# Patient Record
Sex: Male | Born: 1990 | Race: White | Hispanic: No | Marital: Single
Health system: Western US, Academic
[De-identification: ages and names within clinical notes are randomized; demographics above are authoritative.]

## PROBLEM LIST (undated history)

## (undated) DIAGNOSIS — F419 Anxiety disorder, unspecified: Secondary | ICD-10-CM

## (undated) DIAGNOSIS — F32A Depression, unspecified: Secondary | ICD-10-CM

## (undated) DIAGNOSIS — F329 Major depressive disorder, single episode, unspecified: Secondary | ICD-10-CM

## (undated) HISTORY — PX: TESTICLE SURGERY: SHX794

---
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
## 2012-03-05 ENCOUNTER — Other Ambulatory Visit: Payer: Self-pay | Admitting: Physician Assistant

## 2012-03-05 LAB — COMPREHENSIVE METABOLIC PANEL
Albumin: 4.4 g/dL (ref 3.8–5.6)
Alkaline Phosphatase: 85 U/L — ABNORMAL LOW (ref 98–317)
Anion Gap: 7 (ref 7–16)
BUN: 8 mg/dL (ref 7–18)
Bilirubin,Total: 0.5 mg/dL (ref 0.2–1.0)
Chloride: 104 mmol/L (ref 98–107)
EGFR (African American): >60
Glucose: 97 mg/dL (ref 65–99)
Osmolality: 280 (ref 275–301)
Potassium: 4.3 mmol/L (ref 3.5–5.1)
SGOT(AST): 19 U/L (ref 10–41)
Sodium: 141 mmol/L (ref 136–145)
Total Protein: 7.6 g/dL (ref 6.4–8.6)

## 2012-03-05 LAB — CBC WITH DIFFERENTIAL/PLATELET
Basophil #: 0.1 10*3/uL (ref 0.0–0.1)
Eosinophil #: 0 10*3/uL (ref 0.0–0.7)
Eosinophil %: 0.5 %
HGB: 13.4 g/dL (ref 13.0–18.0)
Lymphocyte #: 1.4 10*3/uL (ref 1.0–3.6)
MCH: 32.4 pg (ref 26.0–34.0)
MCV: 95 fL (ref 80–100)
Monocyte #: 0.5 x10 3/mm (ref 0.2–1.0)
Neutrophil %: 65.3 %
Platelet: 254 10*3/uL (ref 150–440)
RBC: 4.13 10*6/uL — ABNORMAL LOW (ref 4.40–5.90)
RDW: 12.9 % (ref 11.5–14.5)

## 2012-03-05 LAB — OCCULT BLOOD X 1 CARD TO LAB, STOOL: Occult Blood, Feces: NEGATIVE

## 2012-03-07 LAB — STOOL CULTURE

## 2012-08-02 ENCOUNTER — Encounter (HOSPITAL_COMMUNITY): Payer: Self-pay | Admitting: *Deleted

## 2012-08-02 ENCOUNTER — Emergency Department (HOSPITAL_COMMUNITY)
Admission: EM | Admit: 2012-08-02 | Discharge: 2012-08-03 | Disposition: A | Discharge status: Home / Self Care | Payer: BC Managed Care – PPO | Attending: Emergency Medicine | Admitting: Emergency Medicine

## 2012-08-02 DIAGNOSIS — F17200 Nicotine dependence, unspecified, uncomplicated: Secondary | ICD-10-CM | POA: Insufficient documentation

## 2012-08-02 DIAGNOSIS — Z8659 Personal history of other mental and behavioral disorders: Secondary | ICD-10-CM | POA: Insufficient documentation

## 2012-08-02 DIAGNOSIS — L589 Radiodermatitis, unspecified: Secondary | ICD-10-CM | POA: Insufficient documentation

## 2012-08-02 DIAGNOSIS — W891XXA Exposure to tanning bed, initial encounter: Secondary | ICD-10-CM

## 2012-08-02 DIAGNOSIS — R5381 Other malaise: Secondary | ICD-10-CM | POA: Insufficient documentation

## 2012-08-02 HISTORY — DX: Anxiety disorder, unspecified: F41.9

## 2012-08-02 NOTE — ED Notes (Signed)
Pt states laid in new tanning bed for 8 mins this morning; presents with sun burn over most of body

## 2012-08-03 MED ORDER — PREDNISONE 20 MG PO TABS: 40.0000 mg | ORAL_TABLET | Freq: Every day | ORAL | Status: DC

## 2012-08-03 MED ORDER — PREDNISONE 20 MG PO TABS
40.0000 mg | ORAL_TABLET | Freq: Once | ORAL | Status: AC
  Administered 2012-08-03: 40 mg via ORAL
  Filled 2012-08-03: qty 2

## 2012-08-03 NOTE — ED Provider Notes (Signed)
Medical screening examination/treatment/procedure(s) were performed by non-physician practitioner and as supervising physician I was immediately available for consultation/collaboration.    Neema Barreira L Mikaelyn Arthurs, MD 08/03/12 0144 

## 2012-08-03 NOTE — ED Provider Notes (Signed)
History     CSN: [id number]  Arrival date & time 08/02/12  2334   First MD Initiated Contact with Patient 08/03/12 0042      No chief complaint on file.   (Consider location/radiation/quality/duration/timing/severity/associated sxs/prior treatment) HPI Comments: Patient went into tanning new/different tanning bed for 8 minutes which he has been doing in older models Now with overall burn, itching and generalized discomfort Tried taking Hydralazine without any relief from his itching   The history is provided by the patient.    Past Medical History  Diagnosis Date  . Anxiety     History reviewed. No pertinent past surgical history.  No family history on file.  History  Substance Use Topics  . Smoking status: Current Every Day Smoker    Types: Cigarettes  . Smokeless tobacco: Not on file  . Alcohol Use: No      Review of Systems  Constitutional: Negative for fever and chills.  HENT: Negative for congestion.   Eyes: Negative for visual disturbance.  Respiratory: Negative for shortness of breath.   Cardiovascular: Negative for chest pain.  Gastrointestinal: Negative for nausea.  Musculoskeletal: Negative for myalgias and joint swelling.  Skin: Positive for color change.  Neurological: Positive for weakness. Negative for dizziness.    Allergies  Review of patient's allergies indicates no known allergies.  Home Medications   Current Outpatient Rx  Name  Route  Sig  Dispense  Refill  . PREDNISONE 20 MG PO TABS   Oral   Take 2 tablets (40 mg total) by mouth daily.   10 tablet   0     BP 138/82  Pulse 107  Temp 97.7 F (36.5 C)  Resp 20  SpO2 100%  Physical Exam  Constitutional: He appears well-developed and well-nourished.  HENT:  Head: Normocephalic.  Eyes: Pupils are equal, round, and reactive to light.  Neck: Normal range of motion.  Musculoskeletal: Normal range of motion.  Neurological: He is alert.  Skin:       Over all sunburn  No  blistering noted scrotum and penis anterior surfaces affected posterior surfaces not reddened     ED Course  Procedures (including critical care time)  Labs Reviewed - No data to display No results found.   1. Sunburn due to tanning bed       MDM   Will treat with Prednisone due to the extent of the burn         Arman Filter, NP 08/03/12 0105

## 2013-08-10 ENCOUNTER — Emergency Department (HOSPITAL_COMMUNITY)
Admission: EM | Admit: 2013-08-10 | Discharge: 2013-08-10 | Disposition: A | Discharge status: Home / Self Care | Payer: BC Managed Care – PPO | Attending: Emergency Medicine | Admitting: Emergency Medicine

## 2013-08-10 ENCOUNTER — Encounter (HOSPITAL_COMMUNITY): Payer: Self-pay | Admitting: Emergency Medicine

## 2013-08-10 DIAGNOSIS — F411 Generalized anxiety disorder: Secondary | ICD-10-CM | POA: Insufficient documentation

## 2013-08-10 DIAGNOSIS — F329 Major depressive disorder, single episode, unspecified: Secondary | ICD-10-CM | POA: Insufficient documentation

## 2013-08-10 DIAGNOSIS — Z79899 Other long term (current) drug therapy: Secondary | ICD-10-CM | POA: Insufficient documentation

## 2013-08-10 DIAGNOSIS — Z792 Long term (current) use of antibiotics: Secondary | ICD-10-CM | POA: Insufficient documentation

## 2013-08-10 DIAGNOSIS — F17200 Nicotine dependence, unspecified, uncomplicated: Secondary | ICD-10-CM | POA: Insufficient documentation

## 2013-08-10 DIAGNOSIS — R300 Dysuria: Secondary | ICD-10-CM | POA: Insufficient documentation

## 2013-08-10 DIAGNOSIS — N4889 Other specified disorders of penis: Secondary | ICD-10-CM

## 2013-08-10 DIAGNOSIS — N489 Disorder of penis, unspecified: Secondary | ICD-10-CM | POA: Insufficient documentation

## 2013-08-10 DIAGNOSIS — F3289 Other specified depressive episodes: Secondary | ICD-10-CM | POA: Insufficient documentation

## 2013-08-10 HISTORY — DX: Depression, unspecified: F32.A

## 2013-08-10 HISTORY — DX: Major depressive disorder, single episode, unspecified: F32.9

## 2013-08-10 LAB — URINALYSIS, ROUTINE W REFLEX MICROSCOPIC
Bilirubin Urine: NEGATIVE
Glucose, UA: NEGATIVE mg/dL
Hgb urine dipstick: NEGATIVE
Ketones, ur: NEGATIVE mg/dL
Leukocytes, UA: NEGATIVE
Nitrite: NEGATIVE
Protein, ur: NEGATIVE mg/dL
Specific Gravity, Urine: 1.003 — ABNORMAL LOW (ref 1.005–1.030)
Urobilinogen, UA: 0.2 mg/dL (ref 0.0–1.0)
pH: 6.5 (ref 5.0–8.0)

## 2013-08-10 MED ORDER — CEFTRIAXONE SODIUM 250 MG IJ SOLR
250.0000 mg | Freq: Once | INTRAMUSCULAR | Status: AC
  Administered 2013-08-10: 250 mg via INTRAMUSCULAR
  Filled 2013-08-10: qty 250

## 2013-08-10 MED ORDER — AZITHROMYCIN 250 MG PO TABS
1000.0000 mg | ORAL_TABLET | Freq: Once | ORAL | Status: AC
  Administered 2013-08-10: 1000 mg via ORAL
  Filled 2013-08-10: qty 4

## 2013-08-10 NOTE — ED Notes (Signed)
Pt c/o penile pain and irritation x 1 week and "knot" on L groin since last night.  Pain score 6/10.  Pt sts pain and irritation started on the tip and has progressed "up the shaft."  Pt has been seen by [facility] and sts clean STD check and UA.  Sts "the gave me a Z-pack, but it has just gotten worse."

## 2013-08-10 NOTE — ED Provider Notes (Signed)
CSN: [id number]     Arrival date & time 08/10/13  1812 History   First MD Initiated Contact with Patient 08/10/13 1910     Chief Complaint  Patient presents with  . Penis Pain   (Consider location/radiation/quality/duration/timing/severity/associated sxs/prior Treatment) HPI Comments: Patient presents with a chief complaint of dysuria and pain of the tip of his penis around the urethra.  He reports that symptoms have been present for the past week and are gradually worsening.  He states that he was seen by [facility] and reports that they did a UA and STD testing, which he reports was negative.  He denies fever, chills, nausea, vomiting, abdominal pain, scrotal swelling, or scrotal pain.  He reports that he is currently sexually active.  He denies prior history of STD's.    The history is provided by the patient.    Past Medical History  Diagnosis Date  . Anxiety   . Depression    Past Surgical History  Procedure Laterality Date  . Testicle surgery     History reviewed. No pertinent family history. History  Substance Use Topics  . Smoking status: Current Some Day Smoker    Types: Cigarettes  . Smokeless tobacco: Never Used  . Alcohol Use: Yes     Comment: occ    Review of Systems  All other systems reviewed and are negative.    Allergies  Review of patient's allergies indicates no known allergies.  Home Medications   Current Outpatient Rx  Name  Route  Sig  Dispense  Refill  . azithromycin (ZITHROMAX) 250 MG tablet   Oral   Take 1,000 mg by mouth once.          . lamoTRIgine (LAMICTAL) 150 MG tablet   Oral   Take 150 mg by mouth daily.          . Multiple Vitamin (MULTIVITAMIN WITH MINERALS) TABS tablet   Oral   Take 1 tablet by mouth daily.         . traZODone (DESYREL) 50 MG tablet   Oral   Take 50 mg by mouth at bedtime as needed for sleep.          Marland Kitchen venlafaxine XR (EFFEXOR-XR) 37.5 MG 24 hr capsule   Oral   Take 37.5 mg by mouth daily  with breakfast.           BP 126/79  Pulse 99  Temp(Src) 98.7 F (37.1 C) (Oral)  Resp 20  SpO2 100% Physical Exam  Nursing note and vitals reviewed. Constitutional: He appears well-developed and well-nourished.  HENT:  Head: Normocephalic and atraumatic.  Neck: Normal range of motion. Neck supple.  Cardiovascular: Normal rate, regular rhythm and normal heart sounds.   Pulmonary/Chest: Effort normal and breath sounds normal.  Abdominal: Soft. Bowel sounds are normal. He exhibits no distension and no mass. There is no tenderness. There is no rebound and no guarding. Hernia confirmed negative in the left inguinal area.  Genitourinary: Penis normal. Prostate is not tender. Left testis shows no mass, no swelling and no tenderness. Left testis is descended. Cremasteric reflex is not absent on the left side. Circumcised. No penile erythema or penile tenderness. No discharge found.  Chaperone present Surgically absent right testicle   Lymphadenopathy:       Right: No inguinal adenopathy present.       Left: No inguinal adenopathy present.  Neurological: He is alert.  Skin: Skin is warm and dry.  Psychiatric: He has a normal  mood and affect.    ED Course  Procedures (including critical care time) Labs Review Labs Reviewed  URINALYSIS, ROUTINE W REFLEX MICROSCOPIC - Abnormal; Notable for the following:    Specific Gravity, Urine 1.003 (*)    All other components within normal limits  GC/CHLAMYDIA PROBE AMP   Imaging Review No results found.  EKG Interpretation   None       MDM  No diagnosis found. Patients presents with a chief complaint of dysuria and pain of the tip of his penis.  Normal GU exam.  No scrotal pain or swelling.  No penile lesions, rash, erythema, or swelling.  No prostate tenderness.  Patient afebrile.  UA negative.  GC/Chlamydia pending.  Patient given prophylactic treatment with Ceftriaxone IM and Azithromycin.  Feel that the patient is stable for  discharge.      Santiago Glad, PA-C 08/10/13 2141

## 2013-08-10 NOTE — ED Provider Notes (Signed)
Medical screening examination/treatment/procedure(s) were performed by non-physician practitioner and as supervising physician I was immediately available for consultation/collaboration.  EKG Interpretation   None       Devoria Albe, MD, Armando Gang   Ward Givens, MD 08/10/13 2251

## 2013-08-11 LAB — GC/CHLAMYDIA PROBE AMP
CT Probe RNA: NEGATIVE
GC Probe RNA: NEGATIVE

## 2014-01-22 ENCOUNTER — Emergency Department (HOSPITAL_COMMUNITY)
Admission: EM | Admit: 2014-01-22 | Discharge: 2014-01-22 | Disposition: A | Discharge status: Home / Self Care | Payer: BC Managed Care – PPO | Source: Home / Self Care

## 2014-01-22 ENCOUNTER — Encounter (HOSPITAL_COMMUNITY): Payer: Self-pay | Admitting: Emergency Medicine

## 2014-01-22 DIAGNOSIS — R0789 Other chest pain: Secondary | ICD-10-CM

## 2014-01-22 DIAGNOSIS — R071 Chest pain on breathing: Secondary | ICD-10-CM

## 2014-01-22 DIAGNOSIS — F411 Generalized anxiety disorder: Secondary | ICD-10-CM

## 2014-01-22 DIAGNOSIS — F419 Anxiety disorder, unspecified: Secondary | ICD-10-CM

## 2014-01-22 NOTE — Discharge Instructions (Signed)
Chest Wall Pain Chest wall pain is pain felt in or around the chest bones and muscles. It may take up to 6 weeks to get better. It may take longer if you are active. Chest wall pain can happen on its own. Other times, things like germs, injury, coughing, or exercise can cause the pain. HOME CARE   Avoid activities that make you tired or cause pain. Try not to use your chest, belly (abdominal), or side muscles. Do not use heavy weights.  Put ice on the sore area.  Put ice in a plastic bag.  Place a towel between your skin and the bag.  Leave the ice on for 15-20 minutes for the first 2 days.  Only take medicine as told by your doctor. GET HELP RIGHT AWAY IF:   You have more pain or are very uncomfortable.  You have a fever.  Your chest pain gets worse.  You have new problems.  You feel sick to your stomach (nauseous) or throw up (vomit).  You start to sweat or feel lightheaded.  You have a cough with mucus (phlegm).  You cough up blood. MAKE SURE YOU:   Understand these instructions.  Will watch your condition.  Will get help right away if you are not doing well or get worse. Document Released: 02/04/2008 Document Revised: 11/10/2011 Document Reviewed: 04/14/2011 [facility] Patient Information 2014 [place], [place].  Panic Attacks Panic attacks are sudden, short-livedsurges of severe anxiety, fear, or discomfort. They may occur for no reason when you are relaxed, when you are anxious, or when you are sleeping. Panic attacks may occur for a number of reasons:   Healthy people occasionally have panic attacks in extreme, life-threatening situations, such as war or natural disasters. Normal anxiety is a protective mechanism of the body that helps [place] react to danger (fight or flight response).  Panic attacks are often seen with anxiety disorders, such as panic disorder, social anxiety disorder, generalized anxiety disorder, and phobias. Anxiety disorders cause excessive or  uncontrollable anxiety. They may interfere with your relationships or other life activities.  Panic attacks are sometimes seen with other mental illnesses such as depression and posttraumatic stress disorder.  Certain medical conditions, prescription medicines, and drugs of abuse can cause panic attacks. SYMPTOMS  Panic attacks start suddenly, peak within 20 minutes, and are accompanied by four or more of the following symptoms:  Pounding heart or fast heart rate (palpitations).  Sweating.  Trembling or shaking.  Shortness of breath or feeling smothered.  Feeling choked.  Chest pain or discomfort.  Nausea or strange feeling in your stomach.  Dizziness, lightheadedness, or feeling like you will faint.  Chills or hot flushes.  Numbness or tingling in your lips or hands and feet.  Feeling that things are not real or feeling that you are not yourself.  Fear of losing control or going crazy.  Fear of dying. Some of these symptoms can mimic serious medical conditions. For example, you may think you are having a heart attack. Although panic attacks can be very scary, they are not life threatening. DIAGNOSIS  Panic attacks are diagnosed through an assessment by your health care provider. Your health care provider will ask questions about your symptoms, such as where and when they occurred. Your health care provider will also ask about your medical history and use of alcohol and drugs, including prescription medicines. Your health care provider may order blood tests or other studies to rule out a serious medical condition. Your health care  provider may refer you to a mental health professional for further evaluation. TREATMENT   Most healthy people who have one or two panic attacks in an extreme, life-threatening situation will not require treatment.  The treatment for panic attacks associated with anxiety disorders or other mental illness typically involves counseling with a mental  health professional, medicine, or a combination of both. Your health care provider will help determine what treatment is best for you.  Panic attacks due to physical illness usually goes away with treatment of the illness. If prescription medicine is causing panic attacks, talk with your health care provider about stopping the medicine, decreasing the dose, or substituting another medicine.  Panic attacks due to alcohol or drug abuse goes away with abstinence. Some adults need professional help in order to stop drinking or using drugs. HOME CARE INSTRUCTIONS   Take all your medicines as prescribed.   Check with your health care provider before starting new prescription or over-the-counter medicines.  Keep all follow up appointments with your health care provider. SEEK MEDICAL CARE IF:  You are not able to take your medicines as prescribed.  Your symptoms do not improve or get worse. SEEK IMMEDIATE MEDICAL CARE IF:   You experience panic attack symptoms that are different than your usual symptoms.  You have serious thoughts about hurting yourself or others.  You are taking medicine for panic attacks and have a serious side effect. MAKE SURE YOU:  Understand these instructions.  Will watch your condition.  Will get help right away if you are not doing well or get worse. Document Released: 08/18/2005 Document Revised: 06/08/2013 Document Reviewed: 04/01/2013 [facility] Patient Information 2014 [place]

## 2014-01-22 NOTE — ED Provider Notes (Signed)
Medical screening examination/treatment/procedure(s) were performed by non-physician practitioner and as supervising physician I was immediately available for consultation/collaboration.  Leslee Home, M.D.  Reuben Likes, MD 01/22/14 [phone]

## 2014-01-22 NOTE — ED Notes (Signed)
Patient suffers from anxiety and thought he was having an anxiety attack Having some SOB Complains of pain #7 from below his breast area all the way to his neck Pain is worse when lying down

## 2014-01-22 NOTE — ED Provider Notes (Signed)
CSN: [id number]     Arrival date & time 01/22/14  1540 History   First MD Initiated Contact with Patient 01/22/14 1636     Chief Complaint  Patient presents with  . Chest Pain   (Consider location/radiation/quality/duration/timing/severity/associated sxs/prior Treatment) HPI Comments: 23 year old male with a history of general anxiety disorder and treated with Effexor and Lamictal and trazodone. He has been taking his Effexor and rectal but not his trazodone. He takes it when necessary. Early this a.m. approximately 3:30 hours he awoke with chest pain from the xiphoid to his upper chest. It was worse when lying down and with taking a deep breath. He had no nausea vomiting, heaviness, tightness or pressure in the chest. He has been having more sighs and feelings of shortness of breath.   Past Medical History  Diagnosis Date  . Anxiety   . Depression    Past Surgical History  Procedure Laterality Date  . Testicle surgery     No family history on file. History  Substance Use Topics  . Smoking status: Current Some Day Smoker    Types: Cigarettes  . Smokeless tobacco: Never Used  . Alcohol Use: Yes     Comment: occ    Review of Systems  Constitutional: Positive for activity change. Negative for fever and fatigue.  HENT: Negative.   Respiratory: Positive for shortness of breath. Negative for cough, choking and chest tightness.   Cardiovascular: Positive for chest pain. Negative for leg swelling.  Gastrointestinal: Negative.   Genitourinary: Negative.   Skin: Negative.   Neurological: Negative for seizures, syncope, facial asymmetry, speech difficulty and headaches.    Allergies  Review of patient's allergies indicates no known allergies.  Home Medications   Prior to Admission medications   Medication Sig Start Date End Date Taking? Authorizing Provider  azithromycin (ZITHROMAX) 250 MG tablet Take 1,000 mg by mouth once.  08/09/13   Historical Provider, MD  lamoTRIgine  (LAMICTAL) 150 MG tablet Take 150 mg by mouth daily.  08/09/13   Historical Provider, MD  Multiple Vitamin (MULTIVITAMIN WITH MINERALS) TABS tablet Take 1 tablet by mouth daily.    Historical Provider, MD  traZODone (DESYREL) 50 MG tablet Take 50 mg by mouth at bedtime as needed for sleep.  08/09/13   Historical Provider, MD  venlafaxine XR (EFFEXOR-XR) 37.5 MG 24 hr capsule Take 37.5 mg by mouth daily with breakfast.  08/09/13   Historical Provider, MD   BP 133/83  Pulse 92  Temp(Src) 99.1 F (37.3 C) (Oral)  Resp 16  SpO2 100% Physical Exam  Constitutional: He is oriented to person, place, and time. He appears well-developed and well-nourished. No distress.  HENT:  Head: Normocephalic and atraumatic.  Mouth/Throat: Oropharynx is clear and moist. No oropharyngeal exudate.  Bilateral TMs are normal  Eyes: Conjunctivae and EOM are normal. Pupils are equal, round, and reactive to light.  Neck: Normal range of motion. Neck supple.  Cardiovascular: Normal rate, regular rhythm, normal heart sounds and intact distal pulses.   No murmur heard. Pulmonary/Chest: Effort normal. No respiratory distress. He has no wheezes. He has no rales.  Tenderness to the left upper parasternal border  Abdominal: Soft. Bowel sounds are normal. There is no tenderness.  Musculoskeletal: Normal range of motion. He exhibits no edema and no tenderness.  Lymphadenopathy:    He has no cervical adenopathy.  Neurological: He is alert and oriented to person, place, and time. No cranial nerve deficit.  Skin: Skin is warm and dry. No rash  noted.  Psychiatric: His speech is normal and behavior is normal. Thought content normal. His mood appears anxious. Cognition and memory are normal.    ED Course  Procedures (including critical care time) Labs Review Labs Reviewed - No data to display  Imaging Review No results found.   MDM   1. Anxiety disorder   2. Chest wall pain      Reassurance Nl EKG Chest wall  tenderness Take trazadone for next 2-3 days. Cont current meds.    Hayden Rasmussenavid Shelah Heatley, NP 01/22/14 1715

## 2014-05-16 ENCOUNTER — Emergency Department (HOSPITAL_COMMUNITY): Payer: BC Managed Care – PPO

## 2014-05-16 ENCOUNTER — Emergency Department (HOSPITAL_COMMUNITY)
Admission: EM | Admit: 2014-05-16 | Discharge: 2014-05-16 | Disposition: A | Discharge status: Home / Self Care | Payer: BC Managed Care – PPO | Attending: Emergency Medicine | Admitting: Emergency Medicine

## 2014-05-16 ENCOUNTER — Encounter (HOSPITAL_COMMUNITY): Payer: Self-pay | Admitting: Emergency Medicine

## 2014-05-16 DIAGNOSIS — R000 Tachycardia, unspecified: Secondary | ICD-10-CM | POA: Insufficient documentation

## 2014-05-16 DIAGNOSIS — Z79899 Other long term (current) drug therapy: Secondary | ICD-10-CM | POA: Insufficient documentation

## 2014-05-16 DIAGNOSIS — F3289 Other specified depressive episodes: Secondary | ICD-10-CM | POA: Insufficient documentation

## 2014-05-16 DIAGNOSIS — S0083XA Contusion of other part of head, initial encounter: Secondary | ICD-10-CM | POA: Insufficient documentation

## 2014-05-16 DIAGNOSIS — W108XXA Fall (on) (from) other stairs and steps, initial encounter: Secondary | ICD-10-CM | POA: Insufficient documentation

## 2014-05-16 DIAGNOSIS — S0003XA Contusion of scalp, initial encounter: Secondary | ICD-10-CM | POA: Diagnosis not present

## 2014-05-16 DIAGNOSIS — F329 Major depressive disorder, single episode, unspecified: Secondary | ICD-10-CM | POA: Insufficient documentation

## 2014-05-16 DIAGNOSIS — S1093XA Contusion of unspecified part of neck, initial encounter: Secondary | ICD-10-CM | POA: Diagnosis not present

## 2014-05-16 DIAGNOSIS — Z23 Encounter for immunization: Secondary | ICD-10-CM | POA: Insufficient documentation

## 2014-05-16 DIAGNOSIS — W19XXXA Unspecified fall, initial encounter: Secondary | ICD-10-CM

## 2014-05-16 DIAGNOSIS — Y9289 Other specified places as the place of occurrence of the external cause: Secondary | ICD-10-CM | POA: Insufficient documentation

## 2014-05-16 DIAGNOSIS — S0100XA Unspecified open wound of scalp, initial encounter: Secondary | ICD-10-CM | POA: Insufficient documentation

## 2014-05-16 DIAGNOSIS — Y9389 Activity, other specified: Secondary | ICD-10-CM | POA: Insufficient documentation

## 2014-05-16 DIAGNOSIS — F411 Generalized anxiety disorder: Secondary | ICD-10-CM | POA: Insufficient documentation

## 2014-05-16 DIAGNOSIS — F17200 Nicotine dependence, unspecified, uncomplicated: Secondary | ICD-10-CM | POA: Insufficient documentation

## 2014-05-16 DIAGNOSIS — S0101XA Laceration without foreign body of scalp, initial encounter: Secondary | ICD-10-CM

## 2014-05-16 LAB — CBC WITH DIFFERENTIAL/PLATELET
Basophils Absolute: 0 10*3/uL (ref 0.0–0.1)
Basophils Relative: 0 % (ref 0–1)
EOS ABS: 0 10*3/uL (ref 0.0–0.7)
EOS PCT: 0 % (ref 0–5)
HCT: 41.6 % (ref 39.0–52.0)
HEMOGLOBIN: 14.7 g/dL (ref 13.0–17.0)
LYMPHS ABS: 1.3 10*3/uL (ref 0.7–4.0)
Lymphocytes Relative: 19 % (ref 12–46)
MCH: 32.1 pg (ref 26.0–34.0)
MCHC: 35.3 g/dL (ref 30.0–36.0)
MCV: 90.8 fL (ref 78.0–100.0)
MONO ABS: 0.3 10*3/uL (ref 0.1–1.0)
MONOS PCT: 5 % (ref 3–12)
Neutro Abs: 5.1 10*3/uL (ref 1.7–7.7)
Neutrophils Relative %: 76 % (ref 43–77)
Platelets: 241 10*3/uL (ref 150–400)
RBC: 4.58 MIL/uL (ref 4.22–5.81)
RDW: 11.9 % (ref 11.5–15.5)
WBC: 6.7 10*3/uL (ref 4.0–10.5)

## 2014-05-16 LAB — BASIC METABOLIC PANEL
Anion gap: 11 (ref 5–15)
BUN: 7 mg/dL (ref 6–23)
CALCIUM: 9.4 mg/dL (ref 8.4–10.5)
CO2: 28 mEq/L (ref 19–32)
CREATININE: 0.89 mg/dL (ref 0.50–1.35)
Chloride: 101 mEq/L (ref 96–112)
GFR calc Af Amer: >90 mL/min (ref >90)
GLUCOSE: 120 mg/dL — AB (ref 70–99)
Potassium: 4.5 mEq/L (ref 3.7–5.3)
Sodium: 140 mEq/L (ref 137–147)

## 2014-05-16 LAB — SAMPLE TO BLOOD BANK

## 2014-05-16 MED ORDER — TETANUS-DIPHTH-ACELL PERTUSSIS 5-2.5-18.5 LF-MCG/0.5 IM SUSP
0.5000 mL | Freq: Once | INTRAMUSCULAR | Status: AC
  Administered 2014-05-16: 0.5 mL via INTRAMUSCULAR
  Filled 2014-05-16: qty 0.5

## 2014-05-16 MED ORDER — OXYCODONE-ACETAMINOPHEN 5-325 MG PO TABS
2.0000 | ORAL_TABLET | Freq: Once | ORAL | Status: AC
Start: 2014-05-16 — End: 2014-05-16
  Administered 2014-05-16: 2 via ORAL
  Filled 2014-05-16: qty 2

## 2014-05-16 MED ORDER — NAPROXEN 500 MG PO TABS: 500.0000 mg | ORAL_TABLET | Freq: Two times a day (BID) | ORAL | Status: DC

## 2014-05-16 MED ORDER — OXYCODONE-ACETAMINOPHEN 5-325 MG PO TABS: 1.0000 | ORAL_TABLET | Freq: Four times a day (QID) | ORAL | Status: DC | PRN

## 2014-05-16 MED ORDER — HYDROMORPHONE HCL PF 1 MG/ML IJ SOLN
0.5000 mg | Freq: Once | INTRAMUSCULAR | Status: AC
  Administered 2014-05-16: 0.5 mg via INTRAVENOUS
  Filled 2014-05-16: qty 1

## 2014-05-16 MED ORDER — SODIUM CHLORIDE 0.9 % IV BOLUS (SEPSIS)
1000.0000 mL | Freq: Once | INTRAVENOUS | Status: AC
  Administered 2014-05-16: 1000 mL via INTRAVENOUS

## 2014-05-16 MED ORDER — KETOROLAC TROMETHAMINE 30 MG/ML IJ SOLN
30.0000 mg | Freq: Once | INTRAMUSCULAR | Status: AC
  Administered 2014-05-16: 30 mg via INTRAVENOUS
  Filled 2014-05-16: qty 1

## 2014-05-16 NOTE — ED Provider Notes (Signed)
Medical screening examination/treatment/procedure(s) were performed by non-physician practitioner and as supervising physician I was immediately available for consultation/collaboration.   EKG Interpretation None        Tomasita Crumble, MD 05/16/14 2026

## 2014-05-16 NOTE — Discharge Instructions (Signed)
Recommend naproxen for pain control. You may take Percocet for severe pain, not controlled by Naproxen. Keep your laceration site clean with soap and water. Return for staple removal in 5-7 days.  Laceration Care, Adult A laceration is a cut or lesion that goes through all layers of the skin and into the tissue just beneath the skin. TREATMENT  Some lacerations may not require closure. Some lacerations may not be able to be closed due to an increased risk of infection. It is important to see your caregiver as soon as possible after an injury to minimize the risk of infection and maximize the opportunity for successful closure. If closure is appropriate, pain medicines may be given, if needed. The wound will be cleaned to help prevent infection. Your caregiver will use stitches (sutures), staples, wound glue (adhesive), or skin adhesive strips to repair the laceration. These tools bring the skin edges together to allow for faster healing and a better cosmetic outcome. However, all wounds will heal with a scar. Once the wound has healed, scarring can be minimized by covering the wound with sunscreen during the day for 1 full year. HOME CARE INSTRUCTIONS  For sutures or staples:  Keep the wound clean and dry.  If you were given a bandage (dressing), you should change it at least once a day. Also, change the dressing if it becomes wet or dirty, or as directed by your caregiver.  Wash the wound with soap and water 2 times a day. Rinse the wound off with water to remove all soap. Pat the wound dry with a clean towel.  After cleaning, apply a thin layer of the antibiotic ointment as recommended by your caregiver. This will help prevent infection and keep the dressing from sticking.  You may shower as usual after the first 24 hours. Do not soak the wound in water until the sutures are removed.  Only take over-the-counter or prescription medicines for pain, discomfort, or fever as directed by your  caregiver.  Get your sutures or staples removed as directed by your caregiver. For skin adhesive strips:  Keep the wound clean and dry.  Do not get the skin adhesive strips wet. You may bathe carefully, using caution to keep the wound dry.  If the wound gets wet, pat it dry with a clean towel.  Skin adhesive strips will fall off on their own. You may trim the strips as the wound heals. Do not remove skin adhesive strips that are still stuck to the wound. They will fall off in time. For wound adhesive:  You may briefly wet your wound in the shower or bath. Do not soak or scrub the wound. Do not swim. Avoid periods of heavy perspiration until the skin adhesive has fallen off on its own. After showering or bathing, gently pat the wound dry with a clean towel.  Do not apply liquid medicine, cream medicine, or ointment medicine to your wound while the skin adhesive is in place. This may loosen the film before your wound is healed.  If a dressing is placed over the wound, be careful not to apply tape directly over the skin adhesive. This may cause the adhesive to be pulled off before the wound is healed.  Avoid prolonged exposure to sunlight or tanning lamps while the skin adhesive is in place. Exposure to ultraviolet light in the first year will darken the scar.  The skin adhesive will usually remain in place for 5 to 10 days, then naturally fall off the  skin. Do not pick at the adhesive film. You may need a tetanus shot if:  You cannot remember when you had your last tetanus shot.  You have never had a tetanus shot. If you get a tetanus shot, your arm may swell, get red, and feel warm to the touch. This is common and not a problem. If you need a tetanus shot and you choose not to have one, there is a rare chance of getting tetanus. Sickness from tetanus can be serious. SEEK MEDICAL CARE IF:   You have redness, swelling, or increasing pain in the wound.  You see a red line that goes away  from the wound.  You have yellowish-white fluid (pus) coming from the wound.  You have a fever.  You notice a bad smell coming from the wound or dressing.  Your wound breaks open before or after sutures have been removed.  You notice something coming out of the wound such as wood or glass.  Your wound is on your hand or foot and you cannot move a finger or toe. SEEK IMMEDIATE MEDICAL CARE IF:   Your pain is not controlled with prescribed medicine.  You have severe swelling around the wound causing pain and numbness or a change in color in your arm, hand, leg, or foot.  Your wound splits open and starts bleeding.  You have worsening numbness, weakness, or loss of function of any joint around or beyond the wound.  You develop painful lumps near the wound or on the skin anywhere on your body. MAKE SURE YOU:   Understand these instructions.  Will watch your condition.  Will get help right away if you are not doing well or get worse. Document Released: 08/18/2005 Document Revised: 11/10/2011 Document Reviewed: 02/11/2011 [facility] Patient Information 2015 [place], [place]. This information is not intended to replace advice given to you by your health care provider. Make sure you discuss any questions you have with your health care provider.

## 2014-05-16 NOTE — ED Provider Notes (Signed)
CSN: [id number]     Arrival date & time 05/16/14  0211 History   First MD Initiated Contact with Patient 05/16/14 0250     Chief Complaint  Patient presents with  . Head Injury    (Consider location/radiation/quality/duration/timing/severity/associated sxs/prior Treatment) HPI Comments: 23 year old male with a history of anxiety and depression presents to the emergency department after a fall down 16 stairs. Patient endorses drinking 1.5 bottles of champagne this evening secondary to emotional stressors. He states that he can no longer be with "the man he loves". Patient states he lasts remembers telling this man to "text him when he got home safe". He next recalls waking up on the floor with blood around him. He denies the use of blood thinners. Complaint at present is of a headache. No N/V, vision changes, hearing changes, extremity numbness, extremity weakness.   Patient is a 23 y.o. male presenting with head injury. The history is provided by the patient and a friend. No language interpreter was used.  Head Injury Associated symptoms: headache   Associated symptoms: no hearing loss, no numbness and no vomiting     Past Medical History  Diagnosis Date  . Anxiety   . Depression    Past Surgical History  Procedure Laterality Date  . Testicle surgery     No family history on file. History  Substance Use Topics  . Smoking status: Current Some Day Smoker    Types: Cigarettes  . Smokeless tobacco: Never Used  . Alcohol Use: Yes     Comment: occ    Review of Systems  HENT: Negative for hearing loss and trouble swallowing.   Eyes: Negative for visual disturbance.  Gastrointestinal: Negative for vomiting.  Skin: Positive for wound.  Neurological: Positive for syncope and headaches. Negative for facial asymmetry, weakness and numbness.  All other systems reviewed and are negative.   Allergies  Review of patient's allergies indicates no known allergies.  Home Medications    Prior to Admission medications   Medication Sig Start Date End Date Taking? Authorizing Provider  lamoTRIgine (LAMICTAL) 150 MG tablet Take 150 mg by mouth daily.  08/09/13  Yes Historical Provider, MD  traZODone (DESYREL) 50 MG tablet Take 50 mg by mouth at bedtime as needed for sleep.  08/09/13  Yes Historical Provider, MD  venlafaxine XR (EFFEXOR-XR) 37.5 MG 24 hr capsule Take 37.5 mg by mouth daily with breakfast.  08/09/13  Yes Historical Provider, MD  naproxen (NAPROSYN) 500 MG tablet Take 1 tablet (500 mg total) by mouth 2 (two) times daily. 05/16/14   Antony Madura, PA-C  oxyCODONE-acetaminophen (PERCOCET/ROXICET) 5-325 MG per tablet Take 1 tablet by mouth every 6 (six) hours as needed for moderate pain or severe pain. 05/16/14   Antony Madura, PA-C   BP 122/72  Pulse 109  Temp(Src) 97.7 F (36.5 C) (Oral)  Resp 22  Ht  (1.778 m)  Wt 184 lb (83.462 kg)  BMI 26.40 kg/m2  SpO2 100%  Physical Exam  Nursing note and vitals reviewed. Constitutional: He is oriented to person, place, and time. He appears well-developed and well-nourished. No distress.  HENT:  Head: Normocephalic and atraumatic.  Mouth/Throat: Oropharynx is clear and moist. No oropharyngeal exudate.  5cm posterior R scalp laceration with hematoma. No skull instability. No battle's sign or raccoon's eyes. Symmetric rise of the uvula with phonation.  Eyes: Conjunctivae and EOM are normal. Pupils are equal, round, and reactive to light. No scleral icterus.  Pupils equal round and reactive to  direct and consensual light  Neck: Normal range of motion.  No bony deformities, crepitus, or step offs to cervical midline. Cervical collar applied post exam.  Cardiovascular: Regular rhythm and intact distal pulses.  Tachycardia present.   Pulmonary/Chest: Effort normal. No respiratory distress. He has no wheezes. He has no rales.  Chest expansion symmetric  Musculoskeletal: Normal range of motion.  Neurological: He is alert and  oriented to person, place, and time. No cranial nerve deficit. He exhibits normal muscle tone. Coordination normal.  GCS 15. Speech is goal oriented. No focal neurologic deficits appreciated. Patient moves extremities without ataxia; finger to nose intact.  Skin: Skin is warm and dry. No rash noted. He is not diaphoretic. No erythema. No pallor.  Psychiatric: He has a normal mood and affect. His behavior is normal.    ED Course  Procedures (including critical care time) Labs Review Labs Reviewed  BASIC METABOLIC PANEL - Abnormal; Notable for the following:    Glucose, Bld 120 (*)    All other components within normal limits  CBC WITH DIFFERENTIAL  SAMPLE TO BLOOD BANK    Imaging Review Ct Head Wo Contrast  05/16/2014   CLINICAL DATA:  Fall downstairs, hit back of head.  EXAM: CT HEAD WITHOUT CONTRAST  CT CERVICAL SPINE WITHOUT CONTRAST  TECHNIQUE: Multidetector CT imaging of the head and cervical spine was performed following the standard protocol without intravenous contrast. Multiplanar CT image reconstructions of the cervical spine were also generated.  COMPARISON:  None.  FINDINGS: CT HEAD FINDINGS  The ventricles and sulci are normal. No intraparenchymal hemorrhage, mass effect nor midline shift. No acute large vascular territory infarcts.  No abnormal extra-axial fluid collections. Basal cisterns are patent.  Small RIGHT parietal scalp hematoma without subcutaneous gas or radiopaque foreign bodies. No skull fracture. The included ocular globes and orbital contents are non-suspicious. The mastoid aircells and included paranasal sinuses are well-aerated.  CT CERVICAL SPINE FINDINGS  Cervical vertebral bodies and posterior elements are intact and aligned with broad reversed cervical lordosis. Intervertebral disc heights preserved. No destructive bony lesions. C1-2 articulation maintained. Included prevertebral and paraspinal soft tissues are unremarkable.  IMPRESSION: CT head: Small RIGHT  parietal scalp hematoma. No skull fracture. No acute intracranial process.  CT cervical spine:  No acute fracture malalignment.   Electronically Signed   By: Awilda Metro   On: 05/16/2014 03:50   Ct Cervical Spine Wo Contrast  05/16/2014   CLINICAL DATA:  Fall downstairs, hit back of head.  EXAM: CT HEAD WITHOUT CONTRAST  CT CERVICAL SPINE WITHOUT CONTRAST  TECHNIQUE: Multidetector CT imaging of the head and cervical spine was performed following the standard protocol without intravenous contrast. Multiplanar CT image reconstructions of the cervical spine were also generated.  COMPARISON:  None.  FINDINGS: CT HEAD FINDINGS  The ventricles and sulci are normal. No intraparenchymal hemorrhage, mass effect nor midline shift. No acute large vascular territory infarcts.  No abnormal extra-axial fluid collections. Basal cisterns are patent.  Small RIGHT parietal scalp hematoma without subcutaneous gas or radiopaque foreign bodies. No skull fracture. The included ocular globes and orbital contents are non-suspicious. The mastoid aircells and included paranasal sinuses are well-aerated.  CT CERVICAL SPINE FINDINGS  Cervical vertebral bodies and posterior elements are intact and aligned with broad reversed cervical lordosis. Intervertebral disc heights preserved. No destructive bony lesions. C1-2 articulation maintained. Included prevertebral and paraspinal soft tissues are unremarkable.  IMPRESSION: CT head: Small RIGHT parietal scalp hematoma. No skull fracture. No acute  intracranial process.  CT cervical spine:  No acute fracture malalignment.   Electronically Signed   By: Awilda Metro   On: 05/16/2014 03:50     EKG Interpretation None     LACERATION REPAIR Performed by: Antony Madura Authorized by: Antony Madura Consent: Verbal consent obtained. Risks and benefits: risks, benefits and alternatives were discussed Consent given by: patient Patient identity confirmed: provided demographic  data Prepped and Draped in normal sterile fashion Wound explored  Laceration Location: R posterior parietal  Laceration Length: 5cm  No Foreign Bodies seen or palpated  Anesthesia: none  Local anesthetic: none  Anesthetic total: n/a  Irrigation method: syringe Amount of cleaning: standard  Skin closure: staples  Number of sutures: 6  Technique: simple  Patient tolerance: Patient tolerated the procedure well with no immediate complications.  MDM   Final diagnoses:  Scalp laceration, initial encounter  Hematoma of scalp, initial encounter  Fall, initial encounter    23 year old male presents to the emergency department for further evaluation of R posterior parietal scalp hematoma and laceration secondary to a fall. Patient endorses drinking 1.5 bottles of champagne PTA. Tetanus updated in ED. GCS 15 on arrival and patient speaking in full sentences. Neurologic exam nonfocal. No bony changes or deformities to cervical spine; however, given ETOH ingestion, cervical collar applied.  Patient's imaging today are negative for acute changes. No skull fracture, ICH, SDH, or midline shift. No bony fracture or malalignment to cervical spine. Cervical collar removed and cervical spine cleared. Patient's laceration was repaired in the ED with 6 staples. Patient tolerated well without immediate complications.   Patient tachycardic on arrival. This has improved over ED course with IV fluids. Suspect tachycardia is secondary to pain and alcohol ingestion. Patient has no evidence of acute blood loss in the form of decreased H/H, elevated BUN, or hypotension. He has also remained neurologically stable, with a nonfocal exam since arrival. Patient speaking in full goal oriented sentences without slurring of speech. He moves his extremities without ataxia. Believe patient is stable and appropriate for discharge with instruction to return for staple removal in 5-7 days. Will prescribe naproxen for  pain control and Percocet for breakthrough pain as needed. Return precautions discussed and provided. Patient agreeable to plan with no unaddressed concerns.   Filed Vitals:   05/16/14 0405 05/16/14 0406 05/16/14 0430 05/16/14 0500  BP: 112/58  108/70 122/72  Pulse:  124 106 109  Temp:      TempSrc:      Resp:  Height:      Weight:      SpO2:  98% 94% 100%       Antony Madura, PA-C 05/16/14 0530

## 2014-05-16 NOTE — ED Notes (Signed)
Pt reports he "apparently fell down the stairs" pt has been drinking tonight so he does not remember the incident. sts he probably lost consciousness. Pt conscious a&ox4. Pt with laceration to back of head. Bleeding controlled.

## 2014-05-16 NOTE — ED Notes (Signed)
[place] collar applied 

## 2014-05-21 ENCOUNTER — Emergency Department (HOSPITAL_COMMUNITY)
Admission: EM | Admit: 2014-05-21 | Discharge: 2014-05-21 | Disposition: A | Discharge status: Home / Self Care | Payer: BC Managed Care – PPO | Attending: Emergency Medicine | Admitting: Emergency Medicine

## 2014-05-21 ENCOUNTER — Encounter (HOSPITAL_COMMUNITY): Payer: Self-pay | Admitting: Emergency Medicine

## 2014-05-21 DIAGNOSIS — F329 Major depressive disorder, single episode, unspecified: Secondary | ICD-10-CM | POA: Diagnosis not present

## 2014-05-21 DIAGNOSIS — F0781 Postconcussional syndrome: Secondary | ICD-10-CM | POA: Diagnosis not present

## 2014-05-21 DIAGNOSIS — F3289 Other specified depressive episodes: Secondary | ICD-10-CM | POA: Insufficient documentation

## 2014-05-21 DIAGNOSIS — Z79899 Other long term (current) drug therapy: Secondary | ICD-10-CM | POA: Diagnosis not present

## 2014-05-21 DIAGNOSIS — Z791 Long term (current) use of non-steroidal anti-inflammatories (NSAID): Secondary | ICD-10-CM | POA: Insufficient documentation

## 2014-05-21 DIAGNOSIS — F411 Generalized anxiety disorder: Secondary | ICD-10-CM | POA: Insufficient documentation

## 2014-05-21 DIAGNOSIS — Z4802 Encounter for removal of sutures: Secondary | ICD-10-CM | POA: Insufficient documentation

## 2014-05-21 DIAGNOSIS — F17200 Nicotine dependence, unspecified, uncomplicated: Secondary | ICD-10-CM | POA: Insufficient documentation

## 2014-05-21 DIAGNOSIS — G44309 Post-traumatic headache, unspecified, not intractable: Secondary | ICD-10-CM

## 2014-05-21 NOTE — ED Notes (Signed)
Pt here for sutures to be removed from head; pt c/o HA and some nausea with light sensitivity since fall

## 2014-05-21 NOTE — ED Provider Notes (Signed)
CSN: [id number]     Arrival date & time 05/21/14  1128 History   First MD Initiated Contact with Patient 05/21/14 1131     Chief Complaint  Patient presents with  . Suture / Staple Removal  . Headache  . Nausea     (Consider location/radiation/quality/duration/timing/severity/associated sxs/prior Treatment) Patient is a 23 y.o. male presenting with suture removal and headaches. The history is provided by the patient.  Suture / Staple Removal Associated symptoms include headaches.  Headache Associated symptoms: no back pain, no pain, no fever, no neck pain, no numbness and no vomiting   pt states had staples placed 5 days ago after fall down steps and is here for suture removal.  Wound healing well without drainage or pain.  Pt denies any new or abruptly worsening symptoms since prior ED visit. He does note persistence of intermittent headaches. Frontal to diffuse, gradual onset. No abrupt worsenening of headaches or severe headaches. No numbness/weakness. No nv. No fever. No faintness/fainting spells. Tetanus utd.     Past Medical History  Diagnosis Date  . Anxiety   . Depression    Past Surgical History  Procedure Laterality Date  . Testicle surgery     History reviewed. No pertinent family history. History  Substance Use Topics  . Smoking status: Current Some Day Smoker    Types: Cigarettes  . Smokeless tobacco: Never Used  . Alcohol Use: Yes     Comment: occ    Review of Systems  Constitutional: Negative for fever.  Eyes: Negative for pain.  Gastrointestinal: Negative for vomiting.  Musculoskeletal: Negative for back pain and neck pain.  Neurological: Positive for headaches. Negative for weakness and numbness.      Allergies  Review of patient's allergies indicates no known allergies.  Home Medications   Prior to Admission medications   Medication Sig Start Date End Date Taking? Authorizing Provider  lamoTRIgine (LAMICTAL) 150 MG tablet Take 150 mg by mouth  daily.  08/09/13   Historical Provider, MD  naproxen (NAPROSYN) 500 MG tablet Take 1 tablet (500 mg total) by mouth 2 (two) times daily. 05/16/14   Antony Madura, PA-C  oxyCODONE-acetaminophen (PERCOCET/ROXICET) 5-325 MG per tablet Take 1 tablet by mouth every 6 (six) hours as needed for moderate pain or severe pain. 05/16/14   Antony Madura, PA-C  traZODone (DESYREL) 50 MG tablet Take 50 mg by mouth at bedtime as needed for sleep.  08/09/13   Historical Provider, MD  venlafaxine XR (EFFEXOR-XR) 37.5 MG 24 hr capsule Take 37.5 mg by mouth daily with breakfast.  08/09/13   Historical Provider, MD   BP 123/75  Pulse 88  Temp(Src) 97.6 F (36.4 C) (Oral)  Resp 18  Ht  (1.778 m)  Wt 184 lb (83.462 kg)  BMI 26.40 kg/m2  SpO2 100% Physical Exam  Nursing note and vitals reviewed. Constitutional: He is oriented to person, place, and time. He appears well-developed and well-nourished. No distress.  HENT:  Healing scalp wound posteriorly w staples intact. No purulent drainage.   Eyes: Pupils are equal, round, and reactive to light.  Neck: Normal range of motion. Neck supple. No tracheal deviation present.  Cardiovascular: Normal rate.   Pulmonary/Chest: Effort normal. No accessory muscle usage. No respiratory distress.  Musculoskeletal: Normal range of motion. He exhibits no edema.  Neurological: He is alert and oriented to person, place, and time.  Speech clear/fluent. Motor intact bil. Steady gait.   Skin: Skin is warm and dry.  Psychiatric: He has  a normal mood and affect.    ED Course  Procedures (including critical care time) Labs Review   MDM   Staples removed.  Wound edges remain well approximated.  Discussed w pt, as only 5 days since staples placed, wound/skin not yet w its normal strength/not fully healed, so to use caution note to apply tension or stress to that area of scalp, or comb or brush, as that may cause wound to open.  Reviewed prior ct reports - neg acute.  Pt w  intermittent headache/achy, felt most c/w postconcussive symptoms.   No acute or abrupt worsening of headaches, or severe headaches. No emesis. Spine nt.     Suzi Roots, MD 05/21/14 [phone]

## 2014-05-21 NOTE — Discharge Instructions (Signed)
It was our pleasure to provide your care today.  We hope that you feel better.  Keep area clean. As wound will still be weak/weaker than normal for the next couple weeks, avoid applying any tension/stress to wound, and avoid combing or brushing through area, as this could cause wound to open.  Take tylenol/advil as need. Follow up with primary care doctor in 1-2 weeks if symptoms fail to improve/resolve. No contact sports until symptoms have resolved. Return to ER if worse, new symptoms, worsening/severe headache, vomiting, fevers, other concern.      Post-Concussion Syndrome Post-concussion syndrome describes the symptoms that can occur after a head injury. These symptoms can last from weeks to months. CAUSES  It is not clear why some head injuries cause post-concussion syndrome. It can occur whether your head injury was mild or severe and whether you were wearing head protection or not.  SIGNS AND SYMPTOMS  Memory difficulties.  Dizziness.  Headaches.  Double vision or blurry vision.  Sensitivity to light.  Hearing difficulties.  Depression.  Tiredness.  Weakness.  Difficulty with concentration.  Difficulty sleeping or staying asleep.  Vomiting.  Poor balance or instability on your feet.  Slow reaction time.  Difficulty learning and remembering things you have heard. DIAGNOSIS  There is no test to determine whether you have post-concussion syndrome. Your health care provider may order an imaging scan of your brain, such as a CT scan, to check for other problems that may be causing your symptoms (such as severe injury inside your skull). TREATMENT  Usually, these problems disappear over time without medical care. Your health care provider may prescribe medicine to help ease your symptoms. It is important to follow up with a neurologist to evaluate your recovery and address any lingering symptoms or issues. HOME CARE INSTRUCTIONS   Only take over-the-counter or  prescription medicines for pain, discomfort, or fever as directed by your health care provider. Do not take aspirin. Aspirin can slow blood clotting.  Sleep with your head slightly elevated to help with headaches.  Avoid any situation where there is potential for another head injury (football, hockey, soccer, basketball, martial arts, downhill snow sports, and horseback riding). Your condition will get worse every time you experience a concussion. You should avoid these activities until you are evaluated by the appropriate follow-up health care providers.  Keep all follow-up appointments as directed by your health care provider. SEEK IMMEDIATE MEDICAL CARE IF:  You develop confusion or unusual drowsiness.  You cannot wake the injured person.  You develop nausea or persistent, forceful vomiting.  You feel like you are moving when you are not (vertigo).  You notice the injured person's eyes moving rapidly back and forth. This may be a sign of vertigo.  You have convulsions or faint.  You have severe, persistent headaches that are not relieved by medicine.  You cannot use your arms or legs normally.  Your pupils change size.  You have clear or bloody discharge from the nose or ears.  Your problems are getting worse, not better. MAKE SURE YOU:  Understand these instructions.  Will watch your condition.  Will get help right away if you are not doing well or get worse. Document Released: 02/07/2002 Document Revised: 06/08/2013 Document Reviewed: 11/23/2013 [facility] Patient Information 2015 [place], [place]. This information is not intended to replace advice given to you by your health care provider. Make sure you discuss any questions you have with your health care provider.     Head Injury  You have received a head injury. It does not appear serious at this time. Headaches and vomiting are common following head injury. It should be easy to awaken from sleeping. Sometimes it is  necessary for you to stay in the emergency department for a while for observation. Sometimes admission to the hospital may be needed. After injuries such as yours, most problems occur within the first 24 hours, but side effects may occur up to 7-10 days after the injury. It is important for you to carefully monitor your condition and contact your health care provider or seek immediate medical care if there is a change in your condition. WHAT ARE THE TYPES OF HEAD INJURIES? Head injuries can be as minor as a bump. Some head injuries can be more severe. More severe head injuries include:  A jarring injury to the brain (concussion).  A bruise of the brain (contusion). This mean there is bleeding in the brain that can cause swelling.  A cracked skull (skull fracture).  Bleeding in the brain that collects, clots, and forms a bump (hematoma). WHAT CAUSES A HEAD INJURY? A serious head injury is most likely to happen to someone who is in a car wreck and is not wearing a seat belt. Other causes of major head injuries include bicycle or motorcycle accidents, sports injuries, and falls. HOW ARE HEAD INJURIES DIAGNOSED? A complete history of the event leading to the injury and your current symptoms will be helpful in diagnosing head injuries. Many times, pictures of the brain, such as CT or MRI are needed to see the extent of the injury. Often, an overnight hospital stay is necessary for observation.  WHEN SHOULD I SEEK IMMEDIATE MEDICAL CARE?  You should get help right away if:  You have confusion or drowsiness.  You feel sick to your stomach (nauseous) or have continued, forceful vomiting.  You have dizziness or unsteadiness that is getting worse.  You have severe, continued headaches not relieved by medicine. Only take over-the-counter or prescription medicines for pain, fever, or discomfort as directed by your health care provider.  You do not have normal function of the arms or legs or are unable  to walk.  You notice changes in the black spots in the center of the colored part of your eye (pupil).  You have a clear or bloody fluid coming from your nose or ears.  You have a loss of vision. During the next 24 hours after the injury, you must stay with someone who can watch you for the warning signs. This person should contact local emergency services (911 in the [place] if you have seizures, you become unconscious, or you are unable to wake up. HOW CAN I PREVENT A HEAD INJURY IN THE FUTURE? The most important factor for preventing major head injuries is avoiding motor vehicle accidents. To minimize the potential for damage to your head, it is crucial to wear seat belts while riding in motor vehicles. Wearing helmets while bike riding and playing collision sports (like football) is also helpful. Also, avoiding dangerous activities around the house will further help reduce your risk of head injury.  WHEN CAN I RETURN TO NORMAL ACTIVITIES AND ATHLETICS? You should be reevaluated by your health care provider before returning to these activities. If you have any of the following symptoms, you should not return to activities or contact sports until 1 week after the symptoms have stopped:  Persistent headache.  Dizziness or vertigo.  Poor attention and concentration.  Confusion.  Memory  problems.  Nausea or vomiting.  Fatigue or tire easily.  Irritability.  Intolerant of bright lights or loud noises.  Anxiety or depression.  Disturbed sleep. MAKE SURE YOU:   Understand these instructions.  Will watch your condition.  Will get help right away if you are not doing well or get worse. Document Released: 08/18/2005 Document Revised: 08/23/2013 Document Reviewed: 04/25/2013 [facility] Patient Information 2015 [place], [place]. This information is not intended to replace advice given to you by your health care provider. Make sure you discuss any questions you have with your health care  provider.   Staple Removal, Care After The staples that were used to close your skin have been removed. The care described here will need to continue until the wound is completely healed and your health care provider confirms that wound care can be stopped. HOME CARE INSTRUCTIONS   Keep the wound site dry and clean. Do not soak it in water.  If skin adhesive strips were applied after the staples were removed, they will begin to peel off in a few days. Allow them to remain in place until they fall off on their own.  If you still have a bandage (dressing), change it at least once a day or as directed by your health care provider. If the dressing sticks, pour warm, sterile water over it until it loosens and can be removed without pulling apart the wound edges. Pat dry with a clean towel.  Apply cream or ointment that stops the growth of bacteria (antibacterial cream or ointment) only if your health care provider has directed you to do so. Place a nonstick bandage over the wound to prevent the dressing from sticking.  Cover the nonstick bandage with a new dressing as directed by your health care provider.  If the bandage becomes wet, dirty, or develops a bad smell, change it as soon as possible.  New scars become sunburned easily. Use sunscreens with a sun protection factor (SPF) of at least 15 when out in the sun. Reapply the SPF every 2 hours.  Only take medicines as directed by your health care provider. SEEK IMMEDIATE MEDICAL CARE IF:   You have redness, swelling, or increasing pain in the wound.  You have pus coming from the wound.  You have a fever.  You notice a bad smell coming from the wound or dressing.  Your wound edges open up after staples have been removed. MAKE SURE YOU:   Understand these instructions.  Will watch your condition.  Will get help right away if you are not doing well or get worse. Document Released: 07/31/2008 Document Revised: 08/23/2013 Document  Reviewed: 07/31/2008 [facility] Patient Information 2015 [place], [place]. This information is not intended to replace advice given to you by your health care provider. Make sure you discuss any questions you have with your health care provider.

## 2015-10-28 IMAGING — CT CT CERVICAL SPINE W/O CM
3 of 6 series · 10 of 33 positions shown, 12 images · non-contrast
Comparison: None.

CLINICAL DATA: Fall downstairs, hit back of head.

EXAM:
CT HEAD WITHOUT CONTRAST
CT CERVICAL SPINE WITHOUT CONTRAST
TECHNIQUE: Multidetector CT imaging of the head and cervical spine was
performed following the standard protocol without intravenous
contrast. Multiplanar CT image reconstructions of the cervical spine
were also generated.

[Series 304: orthogonals · axial · 0.39mm/px · z∈[+39,+82]mm · 2 of 77 slices shown, 3 images]
[im 26/77  soft-tissue]
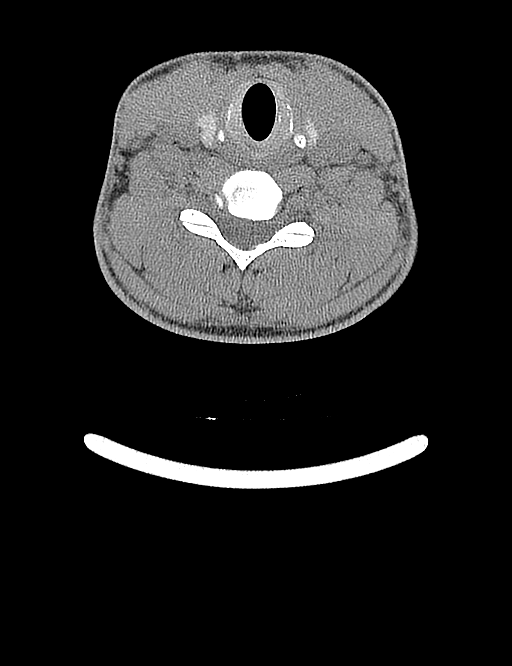
[im 26/77  bone]
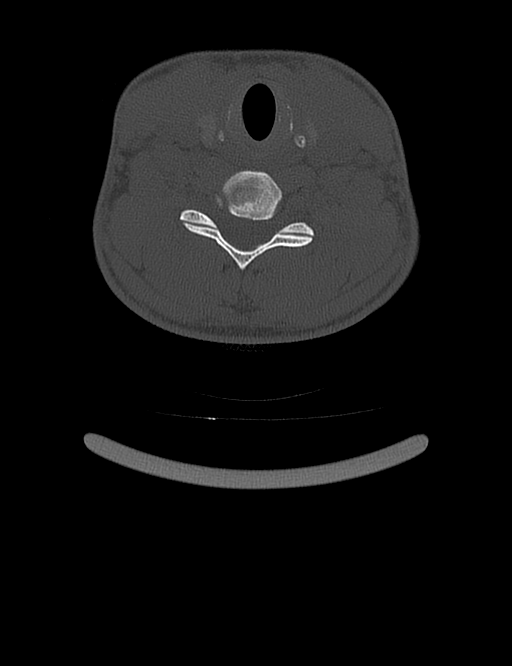
[im 51/77  bone]
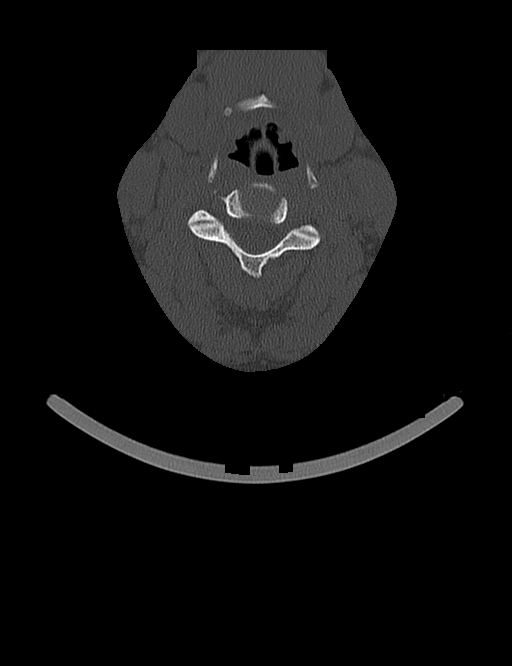

[Series 305: coronals · coronal · 0.39mm/px · 3 of 41 slices shown]
[im 9/41  bone]
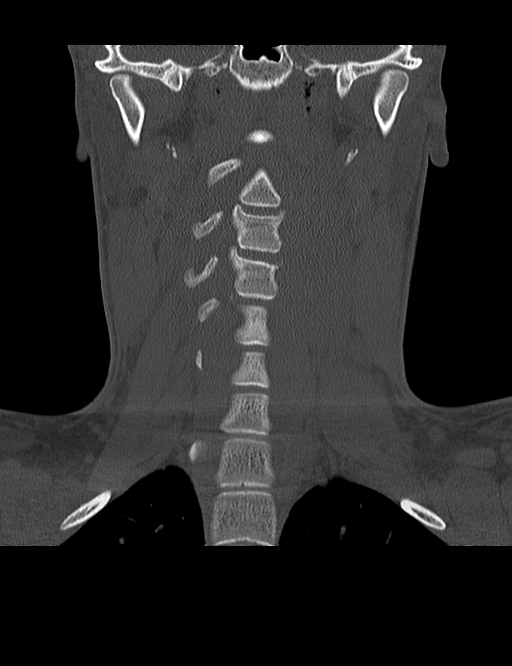
[im 17/41  bone]
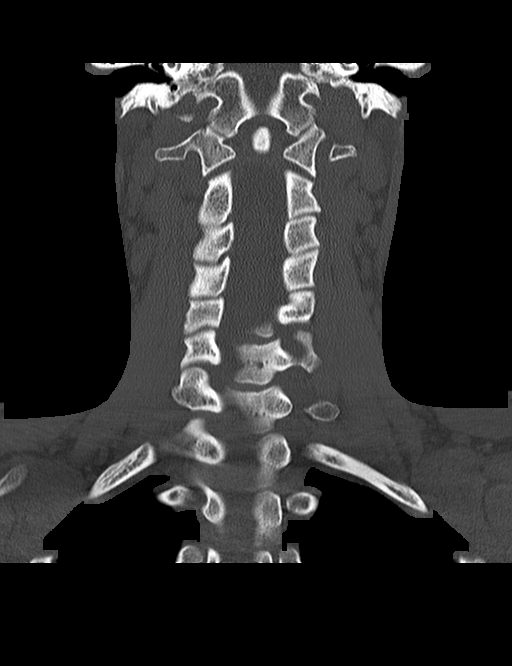
[im 25/41  bone]
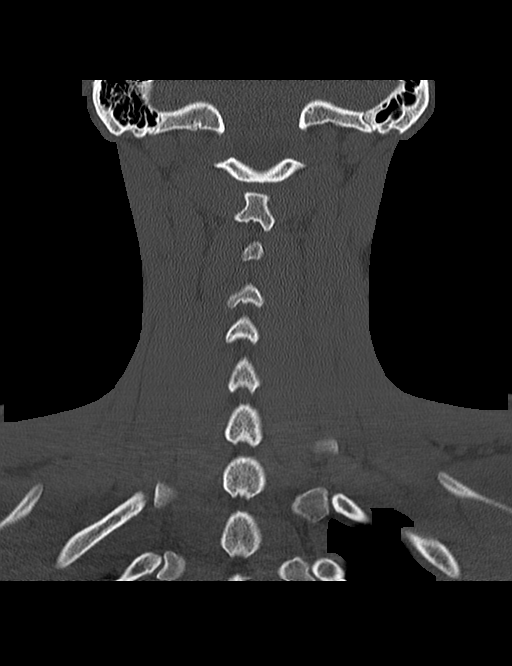

[Series 306: sagittals · sagittal · 0.39mm/px · 5 of 43 slices shown, 6 images]
[im 15/43  bone]
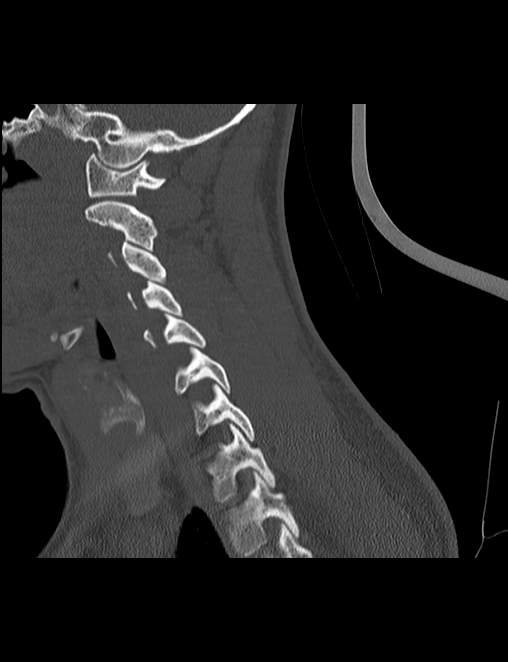
[im 18/43  bone]
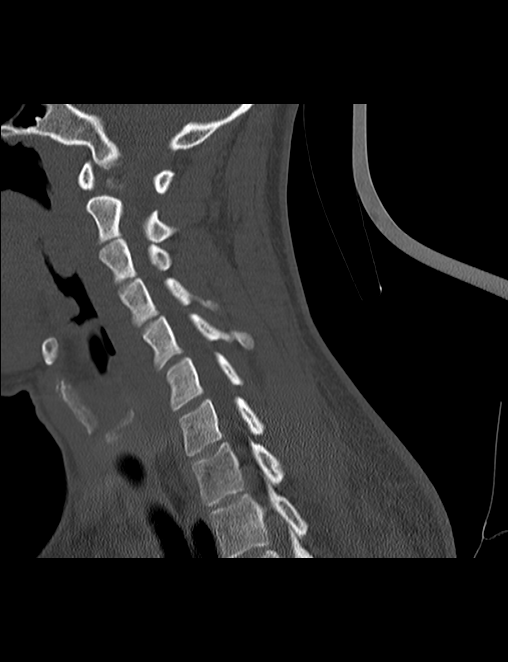
[im 22/43  soft-tissue]
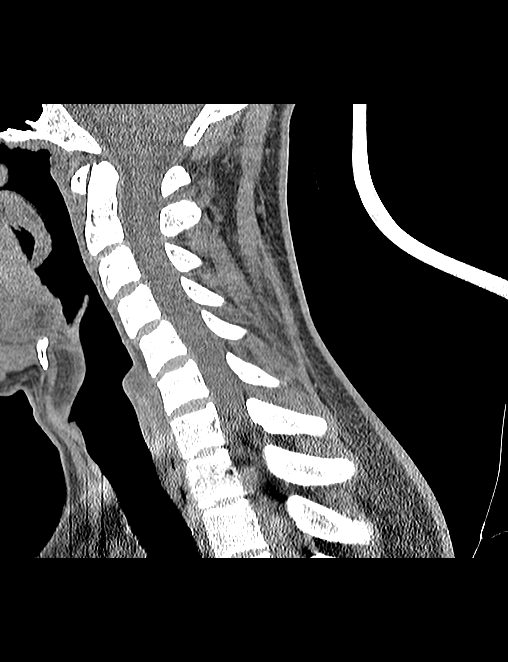
[im 22/43  bone]
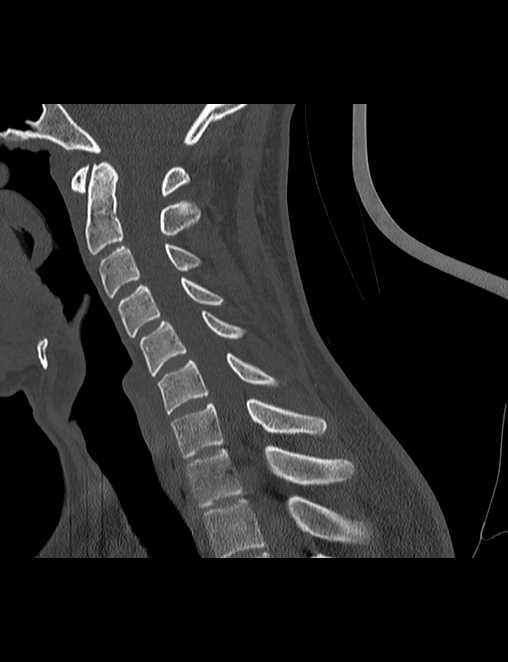
[im 25/43  bone]
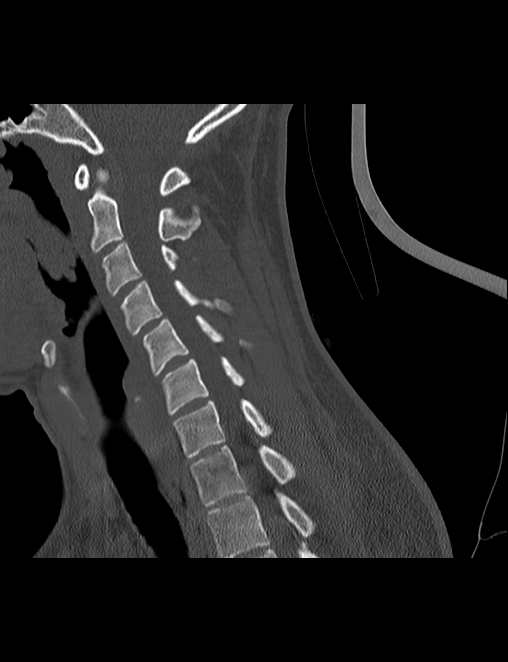
[im 29/43  bone]
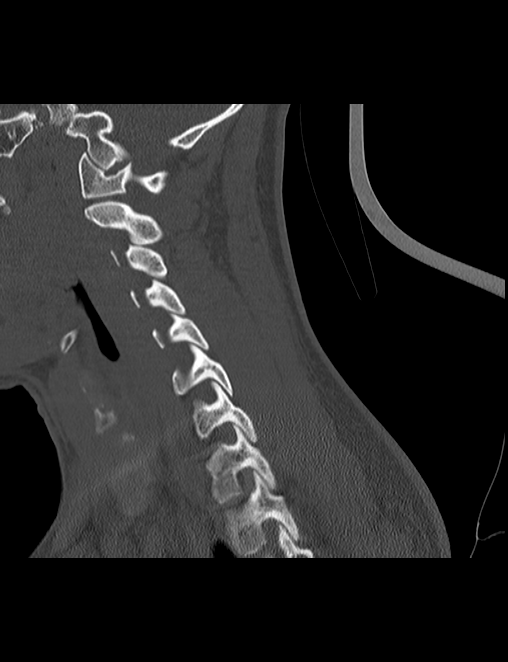

[10 of 33 positions shown; findings below may reference images not displayed]

FINDINGS: CT HEAD FINDINGS

The ventricles and sulci are normal. No intraparenchymal hemorrhage,
mass effect nor midline shift. No acute large vascular territory
infarcts.

No abnormal extra-axial fluid collections. Basal cisterns are
patent.

Small RIGHT parietal scalp hematoma without subcutaneous gas or
radiopaque foreign bodies. No skull fracture. The included ocular
globes and orbital contents are non-suspicious. The mastoid aircells
and included paranasal sinuses are well-aerated.

CT CERVICAL SPINE FINDINGS

Cervical vertebral bodies and posterior elements are intact and
aligned with broad reversed cervical lordosis. Intervertebral disc
heights preserved. No destructive bony lesions. C1-2 articulation
maintained. Included prevertebral and paraspinal soft tissues are
unremarkable.
IMPRESSION: CT head: Small RIGHT parietal scalp hematoma. No skull fracture. No
acute intracranial process.

CT cervical spine:  No acute fracture malalignment.

  By: Ensemo Gognjavec

## 2018-05-30 ENCOUNTER — Ambulatory Visit: Payer: Self-pay | Admitting: Physician Assistant

## 2018-05-30 ENCOUNTER — Encounter: Payer: Self-pay | Admitting: Physician Assistant

## 2018-05-30 VITALS — BP 120/80 | Temp 98.3°F | Wt 195.0 lb

## 2018-05-30 DIAGNOSIS — J019 Acute sinusitis, unspecified: Secondary | ICD-10-CM

## 2018-05-30 DIAGNOSIS — B9689 Other specified bacterial agents as the cause of diseases classified elsewhere: Secondary | ICD-10-CM

## 2018-05-30 MED ORDER — FLUTICASONE PROPIONATE 50 MCG/ACT NA SUSP: 2.0000 | Freq: Every day | NASAL | 0 refills | Status: DC

## 2018-05-30 MED ORDER — AMOXICILLIN-POT CLAVULANATE 875-125 MG PO TABS: 1.0000 | ORAL_TABLET | Freq: Two times a day (BID) | ORAL | 0 refills | Status: DC

## 2018-05-30 NOTE — Progress Notes (Signed)
Patient presents to clinic today c/o nasal congestion, sinus pressure, bilateral ear pain, frontal sinus pain. Denies fever, but notes chills and fatigue. Does not cough is productive of greenish sputum, which may be from PND. Denies recent foreign travel.   Past Medical History:  Diagnosis Date  . Anxiety   . Depression     Current Outpatient Medications on File Prior to Visit  Medication Sig Dispense Refill  . lamoTRIgine (LAMICTAL) 150 MG tablet Take 150 mg by mouth daily.     Marland Kitchen venlafaxine XR (EFFEXOR-XR) 37.5 MG 24 hr capsule Take 112.5 mg by mouth daily with breakfast.     . doxepin (SINEQUAN) 10 MG capsule     . LORazepam (ATIVAN) 1 MG tablet Take by mouth.    . Multiple Vitamin (MULTI-VITAMINS) TABS Take by mouth.     No current facility-administered medications on file prior to visit.     No Known Allergies  No family history on file.  Social History   Socioeconomic History  . Marital status: Single    Spouse name: Not on file  . Number of children: Not on file  . Years of education: Not on file  . Highest education level: Not on file  Occupational History  . Not on file  Social Needs  . Financial resource strain: Not on file  . Food insecurity:    Worry: Not on file    Inability: Not on file  . Transportation needs:    Medical: Not on file    Non-medical: Not on file  Tobacco Use  . Smoking status: Current Some Day Smoker    Types: Cigarettes, E-cigarettes  . Smokeless tobacco: Never Used  Substance and Sexual Activity  . Alcohol use: Yes    Comment: occ  . Drug use: No  . Sexual activity: Not on file  Lifestyle  . Physical activity:    Days per week: Not on file    Minutes per session: Not on file  . Stress: Not on file  Relationships  . Social connections:    Talks on phone: Not on file    Gets together: Not on file    Attends religious service: Not on file    Active member of club or organization: Not on file    Attends meetings of clubs or  organizations: Not on file    Relationship status: Not on file  Other Topics Concern  . Not on file  Social History Narrative  . Not on file   Review of Systems - See HPI.  All other ROS are negative.  BP 120/80   Temp 98.3 F (36.8 C)   Wt 195 lb (88.5 kg)   SpO2 96%   BMI 27.98 kg/m   Physical Exam  Constitutional: He is oriented to person, place, and time. He appears well-developed and well-nourished.  HENT:  Head: Normocephalic and atraumatic.  Right Ear: External ear normal.  Left Ear: External ear normal.  Nose: Mucosal edema and rhinorrhea present. Right sinus exhibits frontal sinus tenderness. Left sinus exhibits frontal sinus tenderness.  Mouth/Throat: Uvula is midline and oropharynx is clear and moist.  Eyes: Conjunctivae are normal.  Neck: Neck supple.  Cardiovascular: Normal rate, regular rhythm, normal heart sounds and intact distal pulses.  Pulmonary/Chest: Effort normal and breath sounds normal. No stridor. No respiratory distress. He has no wheezes. He has no rales. He exhibits no tenderness.  Lymphadenopathy:    He has no cervical adenopathy.  Neurological: He is alert and oriented  to person, place, and time.  Vitals reviewed.  Assessment/Plan: 1. Acute bacterial sinusitis Rx Augmentin.  Increase fluids.  Rest.  Saline nasal spray.  Probiotic.  Mucinex as directed.  Humidifier in bedroom. Flonase per orders.  Call or return to clinic if symptoms are not improving.  - amoxicillin-clavulanate (AUGMENTIN) 875-125 MG tablet; Take 1 tablet by mouth 2 (two) times daily.  Dispense: 14 tablet; Refill: 0 - fluticasone (FLONASE) 50 MCG/ACT nasal spray; Place 2 sprays into both nostrils daily.  Dispense: 16 g; Refill: 0   Piedad Climes, PA-C

## 2018-05-30 NOTE — Patient Instructions (Signed)
Please take antibiotic as directed.  Increase fluid intake.  Use Saline nasal spray.  Take a daily multivitamin. Start the [facility] as directed.  Place a humidifier in the bedroom.  Please call or return clinic if symptoms are not improving.  Sinusitis Sinusitis is redness, soreness, and swelling (inflammation) of the paranasal sinuses. Paranasal sinuses are air pockets within the bones of your face (beneath the eyes, the middle of the forehead, or above the eyes). In healthy paranasal sinuses, mucus is able to drain out, and air is able to circulate through them by way of your nose. However, when your paranasal sinuses are inflamed, mucus and air can become trapped. This can allow bacteria and other germs to grow and cause infection. Sinusitis can develop quickly and last only a short time (acute) or continue over a long period (chronic). Sinusitis that lasts for more than 12 weeks is considered chronic.  CAUSES  Causes of sinusitis include:  Allergies.  Structural abnormalities, such as displacement of the cartilage that separates your nostrils (deviated septum), which can decrease the air flow through your nose and sinuses and affect sinus drainage.  Functional abnormalities, such as when the small hairs (cilia) that line your sinuses and help remove mucus do not work properly or are not present. SYMPTOMS  Symptoms of acute and chronic sinusitis are the same. The primary symptoms are pain and pressure around the affected sinuses. Other symptoms include:  Upper toothache.  Earache.  Headache.  Bad breath.  Decreased sense of smell and taste.  A cough, which worsens when you are lying flat.  Fatigue.  Fever.  Thick drainage from your nose, which often is green and may contain pus (purulent).  Swelling and warmth over the affected sinuses. DIAGNOSIS  Your caregiver will perform a physical exam. During the exam, your caregiver may:  Look in your nose for signs of abnormal growths  in your nostrils (nasal polyps).  Tap over the affected sinus to check for signs of infection.  View the inside of your sinuses (endoscopy) with a special imaging device with a light attached (endoscope), which is inserted into your sinuses. If your caregiver suspects that you have chronic sinusitis, one or more of the following tests may be recommended:  Allergy tests.  Nasal culture A sample of mucus is taken from your nose and sent to a lab and screened for bacteria.  Nasal cytology A sample of mucus is taken from your nose and examined by your caregiver to determine if your sinusitis is related to an allergy. TREATMENT  Most cases of acute sinusitis are related to a viral infection and will resolve on their own within 10 days. Sometimes medicines are prescribed to help relieve symptoms (pain medicine, decongestants, nasal steroid sprays, or saline sprays).  However, for sinusitis related to a bacterial infection, your caregiver will prescribe antibiotic medicines. These are medicines that will help kill the bacteria causing the infection.  Rarely, sinusitis is caused by a fungal infection. In theses cases, your caregiver will prescribe antifungal medicine. For some cases of chronic sinusitis, surgery is needed. Generally, these are cases in which sinusitis recurs more than 3 times per year, despite other treatments. HOME CARE INSTRUCTIONS   Drink plenty of water. Water helps thin the mucus so your sinuses can drain more easily.  Use a humidifier.  Inhale steam 3 to 4 times a day (for example, sit in the bathroom with the shower running).  Apply a warm, moist washcloth to your face 3  to 4 times a day, or as directed by your caregiver.  Use saline nasal sprays to help moisten and clean your sinuses.  Take over-the-counter or prescription medicines for pain, discomfort, or fever only as directed by your caregiver. SEEK IMMEDIATE MEDICAL CARE IF:  You have increasing pain or severe  headaches.  You have nausea, vomiting, or drowsiness.  You have swelling around your face.  You have vision problems.  You have a stiff neck.  You have difficulty breathing. MAKE SURE YOU:   Understand these instructions.  Will watch your condition.  Will get help right away if you are not doing well or get worse. Document Released: 08/18/2005 Document Revised: 11/10/2011 Document Reviewed: 09/02/2011 [facility] Patient Information 2014 [place], [place].

## 2018-06-21 ENCOUNTER — Other Ambulatory Visit: Payer: Self-pay | Admitting: Physician Assistant

## 2018-06-21 DIAGNOSIS — J019 Acute sinusitis, unspecified: Principal | ICD-10-CM

## 2018-06-21 DIAGNOSIS — B9689 Other specified bacterial agents as the cause of diseases classified elsewhere: Secondary | ICD-10-CM

## 2018-07-24 ENCOUNTER — Other Ambulatory Visit: Payer: Self-pay | Admitting: Physician Assistant

## 2018-07-24 DIAGNOSIS — B9689 Other specified bacterial agents as the cause of diseases classified elsewhere: Secondary | ICD-10-CM

## 2018-07-24 DIAGNOSIS — J019 Acute sinusitis, unspecified: Principal | ICD-10-CM

## 2018-07-27 ENCOUNTER — Other Ambulatory Visit: Payer: Self-pay | Admitting: Physician Assistant

## 2018-07-27 DIAGNOSIS — J019 Acute sinusitis, unspecified: Principal | ICD-10-CM

## 2018-07-27 DIAGNOSIS — B9689 Other specified bacterial agents as the cause of diseases classified elsewhere: Secondary | ICD-10-CM

## 2018-09-07 ENCOUNTER — Encounter: Payer: Self-pay | Admitting: Emergency Medicine

## 2018-09-07 ENCOUNTER — Encounter: Payer: Self-pay | Admitting: Physician Assistant

## 2018-09-07 ENCOUNTER — Ambulatory Visit
Admission: EM | Admit: 2018-09-07 | Discharge: 2018-09-07 | Disposition: A | Discharge status: Home / Self Care | Payer: 59 | Attending: Physician Assistant | Admitting: Physician Assistant

## 2018-09-07 ENCOUNTER — Ambulatory Visit: Payer: Self-pay | Admitting: Physician Assistant

## 2018-09-07 VITALS — BP 100/65 | HR 88 | Temp 98.5°F | Resp 16 | Ht 70.0 in | Wt 205.2 lb

## 2018-09-07 DIAGNOSIS — Z0000 Encounter for general adult medical examination without abnormal findings: Secondary | ICD-10-CM

## 2018-09-07 DIAGNOSIS — R300 Dysuria: Secondary | ICD-10-CM

## 2018-09-07 DIAGNOSIS — R350 Frequency of micturition: Secondary | ICD-10-CM | POA: Diagnosis not present

## 2018-09-07 LAB — POCT URINALYSIS DIP (MANUAL ENTRY)
BILIRUBIN UA: NEGATIVE mg/dL
Bilirubin, UA: NEGATIVE
Blood, UA: NEGATIVE
Glucose, UA: NEGATIVE mg/dL
Nitrite, UA: NEGATIVE
PROTEIN UA: NEGATIVE mg/dL
SPEC GRAV UA: 1.01 (ref 1.010–1.025)
Urobilinogen, UA: 0.2 E.U./dL
pH, UA: 7 (ref 5.0–8.0)

## 2018-09-07 MED ORDER — CEPHALEXIN 500 MG PO CAPS: 500.0000 mg | ORAL_CAPSULE | Freq: Two times a day (BID) | ORAL | 0 refills | Status: DC

## 2018-09-07 NOTE — Progress Notes (Signed)
Timothy Hughes  MRN: [id number] DOB: [birth year]  Subjective:  Pt is a 28 y.o. male who presents for work physical exam. Needs form completed. Works as a travel Engineer, civil (consulting). Assigned to work in [place] at the end of this month for 13 weeks.   PMH of bipolar disorder: followed by psychiatry weekly- is planning on doing tele visits while he is in [place]. Feels stable on current tx plan at this time. Denies SI and HI.  Primary Preventative Screenings: STI screening: Pt is not currently sexually active, reports being recently tested for GC/Chlaymydia, which was negative. Has been dealing on/off with intermittent dysuria and urinary frequency for months. Has been seen for this by urgent care, given Rx for pyridium. Helped temporarily but returned. Aware we do not treat/test in office.  Tobacco use/EtOH/substances: Vapes occassionally. Drinks alcohol occassionally. Denies drug use.   Weight/Blood sugar/Diet/Exercise: Trying to focus on healthier lifestyle modifications. Motivated to start working out. Needs to drink more water.  Dentist/Optho: Annually Immunizations: UTD per patient.    There are no active problems to display for this patient.   Current Outpatient Medications on File Prior to Visit  Medication Sig Dispense Refill  . doxepin (SINEQUAN) 10 MG capsule     . lamoTRIgine (LAMICTAL) 150 MG tablet Take 150 mg by mouth daily.     Marland Kitchen LORazepam (ATIVAN) 1 MG tablet Take by mouth.    . Multiple Vitamin (MULTI-VITAMINS) TABS Take by mouth.    . venlafaxine XR (EFFEXOR-XR) 37.5 MG 24 hr capsule Take 112.5 mg by mouth daily with breakfast.     . amoxicillin-clavulanate (AUGMENTIN) 875-125 MG tablet Take 1 tablet by mouth 2 (two) times daily. (Patient not taking: Reported on 09/07/2018) 14 tablet 0  . fluticasone (FLONASE) 50 MCG/ACT nasal spray SPRAY 2 SPRAYS INTO EACH NOSTRIL EVERY DAY (Patient not taking: Reported on 09/07/2018) 16 g 0   No current facility-administered medications on  file prior to visit.     No Known Allergies  Social History   Socioeconomic History  . Marital status: Single    Spouse name: Not on file  . Number of children: Not on file  . Years of education: Not on file  . Highest education level: Not on file  Occupational History  . Not on file  Social Needs  . Financial resource strain: Not on file  . Food insecurity:    Worry: Not on file    Inability: Not on file  . Transportation needs:    Medical: Not on file    Non-medical: Not on file  Tobacco Use  . Smoking status: Current Some Day Smoker    Types: Cigarettes, E-cigarettes  . Smokeless tobacco: Never Used  Substance and Sexual Activity  . Alcohol use: Yes    Comment: occ  . Drug use: No  . Sexual activity: Not on file  Lifestyle  . Physical activity:    Days per week: Not on file    Minutes per session: Not on file  . Stress: Not on file  Relationships  . Social connections:    Talks on phone: Not on file    Gets together: Not on file    Attends religious service: Not on file    Active member of club or organization: Not on file    Attends meetings of clubs or organizations: Not on file    Relationship status: Not on file  Other Topics Concern  . Not on file  Social History Narrative  .  Not on file    Past Surgical History:  Procedure Laterality Date  . TESTICLE SURGERY      No family history on file.  Review of Systems  Constitutional: Negative for activity change, appetite change, chills, diaphoresis, fatigue, fever and unexpected weight change.  HENT: Negative for congestion, dental problem, drooling, ear discharge, ear pain, facial swelling, hearing loss, mouth sores, nosebleeds, postnasal drip, rhinorrhea, sinus pressure, sinus pain, sneezing, sore throat, tinnitus, trouble swallowing and voice change.   Eyes: Negative for photophobia, pain, discharge, redness, itching and visual disturbance.  Respiratory: Negative for apnea, cough, choking, chest  tightness, shortness of breath, wheezing and stridor.   Cardiovascular: Negative for chest pain, palpitations and leg swelling.  Gastrointestinal: Negative for abdominal distention, abdominal pain, anal bleeding, blood in stool, constipation, diarrhea, nausea, rectal pain and vomiting.  Endocrine: Negative for cold intolerance, heat intolerance, polydipsia, polyphagia and polyuria.  Genitourinary: Positive for dysuria and frequency. Negative for decreased urine volume, difficulty urinating, discharge, enuresis, flank pain, genital sores, hematuria, penile pain, penile swelling, scrotal swelling, testicular pain and urgency.  Musculoskeletal: Negative for arthralgias, back pain, gait problem, joint swelling, myalgias, neck pain and neck stiffness.  Skin: Negative for color change, pallor, rash and wound.  Allergic/Immunologic: Negative for environmental allergies, food allergies and immunocompromised state.  Neurological: Negative for dizziness, tremors, seizures, syncope, facial asymmetry, speech difficulty, weakness, light-headedness, numbness and headaches.  Hematological: Negative for adenopathy. Does not bruise/bleed easily.  Psychiatric/Behavioral: Negative for agitation, behavioral problems, confusion, decreased concentration, dysphoric mood, hallucinations, self-injury, sleep disturbance and suicidal ideas. The patient is not nervous/anxious and is not hyperactive.     Objective:  BP 100/65 (BP Location: Right Arm, Patient Position: Sitting, Cuff Size: Normal)   Pulse 88   Temp 98.5 F (36.9 C) (Oral)   Resp 16   Ht 5\' 10"  (1.778 m)   Wt 205 lb 3.2 oz (93.1 kg)   SpO2 100%   BMI 29.44 kg/m   Physical Exam Vitals signs reviewed.  Constitutional:      General: He is not in acute distress.    Appearance: Normal appearance. He is not ill-appearing or toxic-appearing.  HENT:     Head: Normocephalic and atraumatic.     Right Ear: Hearing, tympanic membrane, ear canal and external ear  normal.     Left Ear: Hearing, tympanic membrane, ear canal and external ear normal.     Nose: Nose normal.     Mouth/Throat:     Pharynx: Uvula midline. No oropharyngeal exudate.  Eyes:     Conjunctiva/sclera: Conjunctivae normal.     Pupils: Pupils are equal, round, and reactive to light.  Neck:     Musculoskeletal: Normal range of motion.     Trachea: Trachea normal.  Cardiovascular:     Rate and Rhythm: Normal rate and regular rhythm.     Heart sounds: Normal heart sounds.  Pulmonary:     Effort: Pulmonary effort is normal.     Breath sounds: Normal breath sounds.  Abdominal:     General: Abdomen is flat. Bowel sounds are normal.     Palpations: Abdomen is soft.     Tenderness: There is no abdominal tenderness. There is no right CVA tenderness or left CVA tenderness.  Genitourinary:    Comments: GU exam deferred.  Musculoskeletal: Normal range of motion.  Lymphadenopathy:     Head:     Right side of head: No submental, submandibular, tonsillar, preauricular, posterior auricular or occipital adenopathy.  Left side of head: No submental, submandibular, tonsillar, preauricular, posterior auricular or occipital adenopathy.     Cervical: No cervical adenopathy.     Upper Body:     Right upper body: No supraclavicular adenopathy.     Left upper body: No supraclavicular adenopathy.  Skin:    General: Skin is warm and dry.  Neurological:     Mental Status: He is alert and oriented to person, place, and time.     Gait: Gait is intact.     Deep Tendon Reflexes: Reflexes are normal and symmetric.     Hearing Screening   125Hz  250Hz  500Hz  1000Hz  2000Hz  3000Hz  4000Hz  6000Hz  8000Hz   Right ear:   Pass Pass Pass  Pass    Left ear:   P Pass Pass  Pass      Visual Acuity Screening   Right eye Left eye Both eyes  Without correction: 20/20 20/20 20/20   With correction:       Assessment and Plan :  1. Encounter for physical examination Performed basic work physical exam today.  Discussed with pt that we do not obtain labs or treat/test for STDs or male UTIs. He will need to seek higher level of care for the urinary sympotms he has been experiencing intermittently for the past few months. Pt voices understanding.  He is overall well appearing, NAD. VSS. Normal hearing and vision. No acute findings noted on PE. Work form completed: did document on work form that we do not test for any communicable diseases at our clinic. Scanned document, handed back to patient.   2. Dysuria  Benjiman CoreBrittany Rease Wence, PA-C  [facility] 09/07/2018 12:02 PM

## 2018-09-07 NOTE — Patient Instructions (Signed)

## 2018-09-07 NOTE — Discharge Instructions (Addendum)
Mild bacteria in urine, start Keflex as directed to cover for urinary tract infection. Keep hydrated, urine should be clear to pale yellow in color.  Follow-up with urologist for further evaluation if symptoms not improving.

## 2018-09-07 NOTE — ED Triage Notes (Signed)
Pt presents to [facility] for assessment of penile pain with urinating.  If pt urinates sitting down states he has he also has rectal pain, abdominal pain.  States he has been seen for this previously with no diagnosis.  Pain had resolved, but returned today.

## 2018-09-07 NOTE — ED Notes (Signed)
Patient able to ambulate independently  

## 2018-09-07 NOTE — ED Provider Notes (Signed)
[facility]    CSN: [id number] Arrival date & time: 09/07/18  1234     History   Chief Complaint Chief Complaint  Patient presents with  . Penis Pain    HPI Timothy Hughes is a 28 y.o. male.   28 year old male comes in for 1 to 2-day history of dysuria.  States at times, the pain can radiate up the urinary tract.  Denies hematuria.  Does have urinary frequency. He denies abdominal pain, nausea, vomiting.  Denies fever, chills, night sweats. Denies constipation, diarrhea.  States has not been sexually active since July 2019.  He is on a prep research program, and gets frequent STD testing, which have been negative.  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     Past Medical History:  Diagnosis Date  . Anxiety   . Depression     There are no active problems to display for this patient.   Past Surgical History:  Procedure Laterality Date  . TESTICLE SURGERY         Home Medications    Prior to Admission medications   Medication Sig Start Date End Date Taking? Authorizing Provider  doxepin (SINEQUAN) 10 MG capsule    Yes [provider]  lamoTRIgine (LAMICTAL) 150 MG tablet Take 150 mg by mouth daily.  08/09/13  Yes [provider]  LORazepam (ATIVAN) 1 MG tablet Take by mouth.   Yes [provider]  Multiple Vitamin (MULTI-VITAMINS) TABS Take by mouth.   Yes [provider]  venlafaxine XR (EFFEXOR-XR) 37.5 MG 24 hr capsule Take 112.5 mg by mouth daily with breakfast.  08/09/13  Yes [provider]  cephALEXin (KEFLEX) 500 MG capsule Take 1 capsule (500 mg total) by mouth 2 (two) times daily. 09/07/18   Belinda Fisher, PA-C    Family History History reviewed. No pertinent family history.  Social History Social History   Tobacco Use  . Smoking status: Current Some Day Smoker    Types: Cigarettes, E-cigarettes  . Smokeless tobacco: Never Used  Substance Use Topics  . Alcohol use: Yes      Comment: occ  . Drug use: No     Allergies   Patient has no known allergies.   Review of Systems Review of Systems  Reason unable to perform ROS: See HPI as above.     Physical Exam Triage Vital Signs ED Triage Vitals [09/07/18 1337]  Enc Vitals Group     BP 120/83     Pulse Rate 79     Resp 18     Temp 97.6 F (36.4 C)     Temp Source Oral     SpO2 98 %     Weight      Height      Head Circumference      Peak Flow      Pain Score 4     Pain Loc      Pain Edu?      Excl. in GC?    No data found.  Updated Vital Signs BP 120/83 (BP Location: Left Arm)   Pulse 79   Temp 97.6 F (36.4 C) (Oral)   Resp 18   SpO2 98%   Physical Exam Exam conducted with a chaperone present.  Constitutional:      General: He is not in acute distress.    Appearance: He is well-developed. He is not ill-appearing, toxic-appearing or diaphoretic.  HENT:  Head: Normocephalic and atraumatic.  Cardiovascular:     Rate and Rhythm: Normal rate and regular rhythm.     Heart sounds: Normal heart sounds. No murmur. No friction rub. No gallop.   Pulmonary:     Effort: Pulmonary effort is normal. No respiratory distress.     Breath sounds: Normal breath sounds. No stridor. No wheezing, rhonchi or rales.  Abdominal:     General: Bowel sounds are normal.     Palpations: Abdomen is soft. There is no mass.     Tenderness: There is no abdominal tenderness. There is no guarding or rebound.  Genitourinary:    Penis: Normal and circumcised.      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 Left testicle without swelling, erythema, warmth.  No tenderness to palpation.  No obvious inguinal hernia noted. Skin:    General: Skin is warm and dry.  Neurological:     Mental Status: He is alert and oriented to person, place, and time.  Psychiatric:        Behavior: Behavior normal.        Judgment: Judgment normal.      UC Treatments / Results  Labs (all labs ordered are listed, but only abnormal  results are displayed) Labs Reviewed  POCT URINALYSIS DIP (MANUAL ENTRY) - Abnormal; Notable for the following components:      Result Value   Leukocytes, UA Trace (*)    All other components within normal limits  URINE CULTURE    EKG None  Radiology No results found.  Procedures Procedures (including critical care time)  Medications Ordered in UC Medications - No data to display  Initial Impression / Assessment and Plan / UC Course  I have reviewed the triage vital signs and the nursing notes.  Pertinent labs & imaging results that were available during my care of the patient were reviewed by me and considered in my medical decision making (see chart for details).    Will cover for urinary tract infection with Keflex.  Urine culture sent.  Will have patient follow-up with urology for further evaluation if symptoms not improving.  Return precautions given.  Patient expresses understanding and agrees to plan. Final Clinical Impressions(s) / UC Diagnoses   Final diagnoses:  Dysuria    ED Prescriptions    Medication Sig Dispense Auth. Provider   cephALEXin (KEFLEX) 500 MG capsule Take 1 capsule (500 mg total) by mouth 2 (two) times daily. 10 capsule Threasa Alpha, [place] 09/07/18 1538

## 2018-09-08 ENCOUNTER — Telehealth: Payer: Self-pay | Admitting: Family Medicine

## 2018-09-08 MED ORDER — CEPHALEXIN 500 MG PO CAPS: 500.0000 mg | ORAL_CAPSULE | Freq: Four times a day (QID) | ORAL | 0 refills | Status: AC

## 2018-09-08 NOTE — Telephone Encounter (Signed)
Trouble with prescription Resent to the pharmacy

## 2018-09-09 LAB — URINE CULTURE: CULTURE: NO GROWTH

## 2021-02-26 LAB — VARICELLA ZOSTER VIRUS IGG, SERUM: Varicella-zoster Antibody, IgG: >4000 Index

## 2021-02-28 LAB — HEPATITIS B SURFACE ANTIBODY, QUANTITATIVE: Hep B surf Ab, Quant: 48 m[IU]/mL

## 2021-08-19 ENCOUNTER — Ambulatory Visit: Admit: 2021-08-19 | Payer: PRIVATE HEALTH INSURANCE | Attending: Physician | Primary: Physician

## 2021-08-19 DIAGNOSIS — Z0000 Encounter for general adult medical examination without abnormal findings: Secondary | ICD-10-CM

## 2021-08-19 DIAGNOSIS — F419 Anxiety disorder, unspecified: Secondary | ICD-10-CM

## 2021-08-19 MED ORDER — MULTIVITAMIN TABLET
ORAL | Status: AC
Start: 2021-08-19 — End: ?

## 2021-08-19 MED ORDER — DOXEPIN 10 MG CAPSULE
10 | ORAL | Status: AC
Start: 2021-08-19 — End: ?

## 2021-08-19 MED ORDER — LAMOTRIGINE 200 MG TABLET
200 | Freq: Every day | ORAL | 3.00 refills | 63.50000 days | Status: AC
Start: 2021-08-19 — End: ?

## 2021-08-19 MED ORDER — VENLAFAXINE ER 150 MG CAPSULE,EXTENDED RELEASE 24 HR
150 | Freq: Every day | ORAL | Status: AC
Start: 2021-08-19 — End: ?

## 2021-08-19 MED ORDER — LORAZEPAM 1 MG TABLET
1 | Freq: Every day | ORAL | 0.00 refills | 10.00000 days | Status: AC
Start: 2021-08-19 — End: ?

## 2021-08-19 NOTE — H&P (Signed)
SUBJECTIVE:   Todd Bates is a 30 y.o. adult presenting for wellness physical.     The patient has anxiety and bipolar for years. Taking medicines, seeing a Psychiatrist  But symptoms can still come back sometimes. No suicidal ideations.     Review of Systems -  No fatigue, weakness, weight loss or gain, fevers, chills, night sweats.  No headache, vision changes or hearing changes or eye pain or throat pain  no cough, sputum, dyspnea, wheezing or hemoptysis.  No chest pain, exertional dyspnea, palpitations, syncope, orthopnea, leg edema or paroxysmal nocturnal dyspnea.  No abdominal or flank pain, anorexia, nausea or vomiting, dysphagia, change in bowel habits or black or bloody stools or weight loss.   No dysuria, frequency or hematuria, any discharges.  No pain, swelling, numbness, tingling or weakness in the extremities.  No neurological impairment or TIA's; no amaurosis, diplopia, dysphasia, or unilateral disturbance of motor or sensory function. No loss of balance or vertigo.  No new skin rash or lesions or any significant changes on skin   No bleeding signs or enlarged lymph nodes    Patient Active Problem List   Diagnosis    Medication monitoring encounter    Bipolar 2 disorder (CMS code)    Anxiety    Essential tremor    At risk for HIV due to homosexual contact         Social History     Socioeconomic History    Marital status: Single     Spouse name: Not on file    Number of children: Not on file    Years of education: Not on file    Highest education level: Not on file   Occupational History    Not on file   Tobacco Use    Smoking status: Former     Packs/day: 0.25     Years: 2.00     Pack years: 0.50     Types: Cigarettes     Start date: 06/30/2017     Quit date: 09/09/2019     Years since quitting: 1.9    Smokeless tobacco: Never   Substance and Sexual Activity    Alcohol use: Yes     Alcohol/week: 1.0 standard drink     Types: 1 Glasses of wine per week     Comment: occasionally    Drug  use: Not Currently     Types: Amyl nitrate    Sexual activity: Yes     Partners: Male   Other Topics Concern    Not on file   Social History Narrative    Single, Armed forces operational officer     Social Determinants of Health     Financial Resource Strain: Not on file   Food Insecurity: Not on file   Transportation Needs: Not on file          OBJECTIVE:   Vitals:    08/19/21 1251   BP: 127/73   BP Location: Right upper arm   Patient Position: Sitting   Cuff Size: Large Adult   Pulse: 68   Temp: 36.2 C (97.1 F)   TempSrc: Tympanic   Weight: 93.9 kg (207 lb)   Height: 178.6 cm (5' 10.32")     General appearance:  Alert, well appearance  Mouth: Oral cavity, tongue, pharynx and palate have no inflammation or suspicious lesions. Tonsils, Teeth normal without tenderness.  Nasal exam: septum midline, no deformities, nares patent, normal mucosa without swelling, no polyps, no bleeding.  Ears: TM intact  without perforation or effusion, external canal normal. No significant ceruminosis noted.   Eyes: pupil reactive, normal conjunctiva, normal fundus, normal cornea.  Neck: supple, thyroid normal, no adenopathy  Lungs: Chest is clear, no wheezing or rales. Normal symmetric air entry throughout both lung fields. No chest wall deformities or tenderness.   Heart: S1 and S2 normal, no murmurs, clicks, gallops or rubs. Regular rate and rhythm. Chest is clear; no wheezes or rales. No edema or JVD.   Abdomen: The abdomen is soft without tenderness, guarding, mass, rebound or organomegaly. Bowel sounds are normal. No CVA tenderness or inguinal adenopathy noted.    Genitalia:pt declined    Spine: Cervical, thoracic and lumbar spine exam is normal without tenderness, no redness or swelling or masses or kyphoscoliosis. Full range of motion without pain is noted.   Skin: I note only benign skin findings. No unusual rashes or suspicious skin lesions noted. Nails appear normal.    Neuro:   oriented to place, time and people. Cranial nerves are normal. No  bruits. Normal deep tendon reflexes, strength, sensation and coordination.    Extremities:  peripheral pulses normal, no pedal edema, no clubbing or cyanosis or redness      No results found for: WBC, HGB, HCT, PLT, CHOL, HDL, LDLDIRECT, ALT, AST, NA, K, CL, CREATININE, BUN, CO2, TSH, PSA, INR, GLUF, HGBA1C, MICROALBUR  No results found for: CREATININE, CREAT, CWB, CREATI    No results found for: CHOL, LDL, TLDL, HDL, TG, TRID, CHOLRATIO  No results found for: FBS, GLFP, GTFC, GOTF, GLUPEXQ      ASSESSMENT and PLANS:   Annual physical exam    -     Lipid Panel (incl. LDL, HDL, Total Chol. and Trig.); Future  -     Glucose, fasting; Future    Positive depression screening    -     Ambulatory Referral to Primary Care Behavioral Health    Anxiety  Bipolar 2 disorder (CMS code)  The patient has anxiety and bipolar for years. Taking medicines, seeing a Psychiatrist  But symptoms can still come back sometimes. No suicidal ideations.     Stress reduction    Follow with Psychologist closely       Discuss the causes of your stress with a good friend or family member. Or you can join a support group for people with similar problems. Talking to others sometimes relieves stress.   Get at least 30 minutes of exercise on most days of the week. Walking is a good choice. You also may want to do other activities, such as running, swimming, cycling, or playing tennis or team sports.     follow up with your Psychiatrist , continue medicines    referral sent for Psychologist per the patient     -     Alanine Transaminase; Future  -     Aspartate Transaminase; Future  -     Complete Blood Count with Differential; Future  -     Creatinine, Serum / Plasma; Future  -     Potassium, Serum / Plasma; Future  -     Glucose, fasting; Future  -     Thyroid Stimulating Hormone; Future    Essential tremor  intermittent for years. No symptoms now.     At risk for HIV due to homosexual contact  The patient is in prep research. The research will be  finished soon.    Obesity, unspecified classification, unspecified obesity type, unspecified whether serious comorbidity present  Numbers were out of range    Please eat a diet with low saturated fat. 1800 kcal a day. You should avoid fat that comes from meat, poultry, or dairy and eat more vegetables, nonfat dairy products, and whole grains. You can eat moderate portions of lean meat and poultry without the skin. Exercise regularly 5-7 days a week and maintain a healthy body weight.               Counseling:    Encouraged exercise (biking, jogging, even brisk walking): at least 30 minutes each time, 3-5 times a week.  - Discussed healthy diet: use low fat/carb and high fiber diet. well balance food, 1800 kcal a day    -Discussed No smoking, restrict alcohol use 1-2 or less a day, no drugs,        Prevention: Laboratory tests, Tdap, Pneumonia vaccines,  Colon cancer screening, Flu vaccine      Call or return to clinic prn if these symptoms worsen or fail to improve as anticipated

## 2021-08-21 NOTE — Telephone Encounter (Signed)
Behavioral Health Navigation Update    Contacted and spoke with pt to encourage them to review supportive resources sent by the Behavioral Health Navigation Team via MyChart. Patient voiced understanding and will review. Instructed the pt to contact the BHN team should they encounter any barriers in linking to care.  Referral now closed.

## 2021-09-17 ENCOUNTER — Ambulatory Visit: Admit: 2021-09-17 | Payer: PRIVATE HEALTH INSURANCE | Primary: Physician

## 2021-09-17 DIAGNOSIS — F3181 Bipolar II disorder: Secondary | ICD-10-CM

## 2021-09-17 LAB — LIPID PANEL (INCL. LDL, HDL, TOTAL CHOL. AND TRIG.)
Chol HDL Ratio: 3.4 (ref <6.0)
Cholesterol, HDL: 56 mg/dL (ref >39)
Cholesterol, Total: 189 mg/dL (ref <200)
LDL Cholesterol: 120 mg/dL (ref <130)
Non HDL Cholesterol: 133 mg/dL (ref <160)
Triglycerides, serum: 64 mg/dL (ref <200)

## 2021-09-17 LAB — COMPLETE BLOOD COUNT WITH DIFFERENTIAL
Abs Basophils: 0.04 10*9/L (ref 0.00–0.10)
Abs Eosinophils: 0.12 10*9/L (ref 0.00–0.40)
Abs Imm Granulocytes: 0.01 10*9/L (ref <0.10)
Abs Lymphocytes: 1.5 10*9/L (ref 1.00–3.40)
Abs Monocytes: 0.51 10*9/L (ref 0.20–0.80)
Abs Neutrophils: 3.81 10*9/L (ref 1.80–6.80)
Hematocrit: 41.1 % (ref 41.0–53.0)
Hemoglobin: 14.6 g/dL (ref 13.6–17.5)
MCH: 33.4 pg (ref 26.0–34.0)
MCHC: 35.5 g/dL (ref 31.0–36.0)
MCV: 94 fL (ref 80–100)
MPV: 10.1 fL (ref 9.1–12.6)
Platelet Count: 261 10*9/L (ref 140–450)
RBC Count: 4.37 10*12/L — ABNORMAL LOW (ref 4.40–5.90)
RDW-CV: 11.6 % — ABNORMAL LOW (ref 11.7–14.4)
WBC Count: 6 10*9/L (ref 3.4–10.0)

## 2021-09-17 LAB — GLUCOSE, FASTING: Glucose, fasting: 96 mg/dL (ref 70–99)

## 2021-09-17 LAB — POTASSIUM, SERUM / PLASMA: Potassium, Serum / Plasma: 4.9 mmol/L (ref 3.5–5.0)

## 2021-09-17 LAB — CREATININE, SERUM / PLASMA
Creatinine: 1 mg/dL (ref 0.73–1.24)
eGFRcr: 105 mL/min/{1.73_m2} (ref >59)

## 2021-09-17 LAB — ALANINE TRANSAMINASE: Alanine transaminase: 16 U/L (ref 10–61)

## 2021-09-17 LAB — ASPARTATE TRANSAMINASE: AST: 16 U/L (ref 5–44)

## 2021-09-17 LAB — THYROID STIMULATING HORMONE: Thyroid Stimulating Hormone: 2.03 mIU/L (ref 0.45–4.12)

## 2021-10-15 ENCOUNTER — Ambulatory Visit: Admit: 2021-10-15 | Payer: PRIVATE HEALTH INSURANCE | Attending: General Practice | Primary: Physician

## 2021-10-15 DIAGNOSIS — K130 Diseases of lips: Secondary | ICD-10-CM

## 2021-10-15 MED ORDER — TRIAMCINOLONE ACETONIDE 0.025 % TOPICAL OINTMENT
0.025 | TOPICAL | 0 refills | Status: DC
Start: 2021-10-15 — End: 2022-01-30

## 2021-10-15 MED ORDER — MUPIROCIN 2 % TOPICAL OINTMENT
2 | Freq: Two times a day (BID) | TOPICAL | 0 refills | 14.00000 days | Status: AC
Start: 2021-10-15 — End: 2022-01-30

## 2021-10-15 NOTE — Progress Notes (Signed)
HPI    #rash at corner of the mouth  L outside corner of the lips  First noted a week ago  Thought it might be a cold sore, so tried some abreva and blistex with numbing in it  Abreva didn't help, blistex made it more comfortable  Had something very similar 3 years ago and found to be MSSA and was given mupirocin to treat  First noted it to be just some redness, then it opened and got crusty  It is mostly crusted over, but then it opens when they touch the spot or move the lips  Crust is a bit golden  It is painful, there is also some intermittent itching  No other sores anywhere on the body  No known exposure to HSV    Current Outpatient Medications on File Prior to Visit   Medication Sig Dispense Refill    doxepin (SINEQUAN) 10 mg capsule       lamoTRIgine (LAMICTAL) 200 mg tablet Take 200 mg by mouth daily      LORazepam (ATIVAN) 1 mg tablet Take 1 mg by mouth daily      multivitamin tablet Take by mouth      venlafaxine (EFFEXOR-XR) 150 mg 24 hr capsule Take 150 mg by mouth daily       No current facility-administered medications on file prior to visit.     Allergies/Contraindications   Allergen Reactions    Fig Hives, Itching and Rash    Quince Seed Hives, Itching and Rash     Past Medical History:   Diagnosis Date    Allergy     Anemia     Anxiety 06/2011    Asthma 12/2011    Hx of exercise induced asthma in college    Depression 06/2011    Headache     Mental disorder 06/2011    Sleep disorder 06/2011       Review of Systems - History obtained from the patient  Constitutional: no fevers, chills or sweats.  Dermatological: no other rash, wounds, sores    PHYSICAL EXAM    General: Alert.  Appears stated age.  Cooperative and conversant.  No acute distress.  Head: Normocephalic, without obvious abnormality.  Atraumatic.  Eyes: Anicteric sclera.  Lids & lashes normal, normal conjunctiva.  Extraocular motions intact.  Nose: nares normal, no drainage.  Mouth: lips, mucosa, and tongue normal;  Lungs:  Normal respiratory effort, speaking complete sentences.   Skin: +patch of erythema with overlying flaking and crusting at L lateral corner of lips, see pictures below                    ASSESSMENT AND PLAN  Diagnoses and all orders for this visit:    Angular cheilitis  Hx and limited video and photo exam most consistent with angular cheilitis.  Likely bacterial etiology given hx of same and golden crust, will treat as such.  - topical abx and steroid BID x7-14 days as below  - reviewed gentle skin care, safe and appropriate use of medication  - RTC if new or worsening symptoms, no improvement after 1 wk  -     mupirocin (BACTROBAN) 2 % ointment; Apply topically Twice a day Use as instructed  -     triamcinolone (KENALOG) 0.025 % ointment; Apply to affected skin twice daily for up to 14 days        The patient expressed understanding and agreement with the plan above.    Julian Hy  Donnie Coffin, FNP-BC  10/15/21    I, Gary Fleet, NP, spent a total of 22 minutes on this patient's care on the day of their visit excluding time spent related to any billed procedures. This time includes time spent with the patient as well as time spent documenting in the medical record, reviewing patient's records and tests, obtaining history, placing orders, communicating with other healthcare professionals, counseling the patient, family, or caregiver, and/or care coordination for the diagnoses above.       I performed this consultation using real-time Telehealth tools, including a live video connection between my location and the patient's location. Prior to initiating the consultation, I obtained informed verbal consent to perform this consultation using Telehealth tools and answered all the questions about the Telehealth interaction.    This is an independent service.   The available consultant for this service is Oneita Jolly, MD.

## 2022-01-30 MED ORDER — MUPIROCIN 2 % TOPICAL OINTMENT
2 | Freq: Two times a day (BID) | TOPICAL | 0 refills | Status: AC
Start: 2022-01-30 — End: ?

## 2022-01-30 MED ORDER — TRIAMCINOLONE ACETONIDE 0.025 % TOPICAL OINTMENT
0.025 | TOPICAL | 0 refills | Status: AC
Start: 2022-01-30 — End: ?

## 2022-01-30 NOTE — Telephone Encounter (Signed)
RN-please review and sign Rx if appropriate. Thank you

## 2022-12-03 ENCOUNTER — Ambulatory Visit: Admit: 2022-12-03 | Payer: PRIVATE HEALTH INSURANCE | Attending: General Practice | Primary: General Practice

## 2022-12-03 DIAGNOSIS — G250 Essential tremor: Secondary | ICD-10-CM

## 2022-12-03 MED ORDER — DOXYCYCLINE HYCLATE 100 MG TABLET
100 | ORAL | Status: AC
Start: 2022-12-03 — End: ?

## 2022-12-03 MED ORDER — APRETUDE 600 MG/3 ML (200 MG/ML) IM SUSPENSION, EXTENDED RELEASE
600 | INTRAMUSCULAR | Status: AC
Start: 2022-12-03 — End: ?

## 2022-12-03 MED ORDER — PROPRANOLOL ER 60 MG CAPSULE,24 HR,EXTENDED RELEASE
60 | ORAL_CAPSULE | Freq: Every day | ORAL | 11 refills | 30.00000 days | Status: DC
Start: 2022-12-03 — End: 2023-01-23

## 2022-12-03 MED ORDER — VENLAFAXINE ER 75 MG CAPSULE,EXTENDED RELEASE 24 HR
75 | Freq: Every day | ORAL | Status: AC
Start: 2022-12-03 — End: ?

## 2022-12-03 MED ORDER — DOXEPIN 25 MG CAPSULE
25 mg | Freq: Every day | ORAL | Status: DC
Start: 2022-12-03 — End: 2023-09-28

## 2022-12-03 NOTE — Patient Instructions (Addendum)
Nice to see you today, Todd Bates! Below is the plan we discussed.  Please reach out with any questions or concerns!    1) Propranolol ordered for daily use.  Please wait to start until after completing Ziopatch study.    2) Ziopatch ordered today.  For Zio Patch: This device can be applied with an in-person appointment or sent to your home via home enrollment. For home enrollment, our technical staff can walk you through the application. Please call [phone], option 1 to set up either option and to arrange a technical staff call.     3) Blood work orders sent today.  These are fasting labs, so please don't eat or drink for 8-12 hours beforehand.  Water is OK.  Laboratory locations, hours, and scheduling information below.      [facility] - Laboratory Services, Outpatient  Website: Laboratory Medicine and Pathology  [facility]    For your convenience, selected locations are accepting self-scheduled appointments through the [employer] or app. If you are self-scheduling, download our special test instructions which have details about where and when various tests are done.    If there are 'FASTING LABS' (ie fasting metabolic panel, blood sugar), please DO NOT eat for at least 10 hours prior to the test.  You CAN drink water and take medicines before the lab test.              [facility] (Appointment and [facility])            [address]            [place], [place] [address]            Phone [phone]            Hours: Monday - Friday 7:30 am - 6:00 pm  Open on most Holidays 8:00am- 4:30pm, except Thanksgiving Day, Christmas Day, and New Year's Day                [facility] (Appointment and [place])            [address]            [place], [place] [address]            Phone [phone]            Hours: Monday - Friday 7:00 am - 5:30 pm            Closed on all UC holidays              [facility] [address] (Appointment and [place] [facility])            [address] Floor Room            [place], [place] [address]            Phone: [phone]            Hours: Monday-Friday 7:00 am - 5:30 pm            Closed on all UC holidays              [facility] (Walk-Ins Only)            [address]            [place], [place] [address]  Phone: [phone]            Hours: Monday to Friday 8:00 am - 4:00 pm            Closed on all UC holidays               [facility] (Appointment and [facility])            [address] Floor, [place] [address]            Phone: [phone]            Hours: Monday to Friday 8:00 am - 12:00pm, 1:00pm - 4:30 pm            Closed on all UC holidays

## 2022-12-03 NOTE — Progress Notes (Signed)
SUBJECTIVE    Todd BernZachary Bates is a 32 y.o. adult complaining of Annual Exam and Tremors      HPI:    #mood  Dx with bipolar disorder, GAD, insomnia   Follows with therapist 1x/wk  Sees outside psychiatry  Endorses 1 day of SI, without plan or intent   Uncovering lots of new stuff lately, but mood mostly feels baseline  Works night shift, which doesn't help anything   Been on lamotrigine x7 yrs    #tremor  Started when increasing dose of lamotrigine from 200mg  to 300mg   Diagnosed as essential, though likely from medication   Both hands, R is usually worse  Can happen at rest, happens more often with actions  Comes on randomly, noted at least 4x/wk  Can last for hrs at a time  Was seen by neurologist, not concerned    Was offered propranolol at one point, never took it   Doesn't have any other uncontrolled movements   Maybe some new balance things?     #palpitations  Since January  Intermittent, a few times/wk  Notices them more when laying in bed, when trying to go to sleep  Feels like a skipped beat  Will happen multiple times in a row   Sometimes feels like it's beating harder  No other symptoms when then happen  Never noted with activity    #HCM    Diet: following noom.  Lots of veggies and fruits, whole grains, Malaw[place]urkey or chicken, soy milk, yogurt   Lots of caffeine  2 drinks every other week    Exercise: weights with personal trainer 2x/wk, cardio 4x/wk with spin or dance, lots of walking     Sleep: works night shift, which makes insomnia worse     Occasional, intermittent partners, no current STI concerns   Apretude and doxy for PrEP, PEP through [employer]        Current Outpatient Medications:     APRETUDE 600 mg/3 mL (200 mg/mL) injection, Inject into the muscle, Disp: , Rfl:     doxepin (SINEQUAN) 10 mg capsule, , Disp: , Rfl:     doxepin (SINEQUAN) 25 mg capsule, Take 3 capsules (75 mg total) by mouth nightly at bedtime, Disp: , Rfl:     doxycycline (VIBRA-TABS) 100 mg tablet, Take by mouth, Disp: , Rfl:      lamoTRIgine (LAMICTAL) 200 mg tablet, Take 1 tablet (200 mg total) by mouth daily, Disp: , Rfl:     LORazepam (ATIVAN) 1 mg tablet, Take 1 tablet (1 mg total) by mouth daily, Disp: , Rfl:     multivitamin tablet, Take by mouth, Disp: , Rfl:     mupirocin (BACTROBAN) 2 % ointment, Apply topically Twice a day Use as instructed, Disp: 30 g, Rfl: 0    propranoloL (INDERAL LA) 60 mg 24 hr capsule, Take 1 capsule (60 mg total) by mouth daily, Disp: 30 capsule, Rfl: 11    triamcinolone (KENALOG) 0.025 % ointment, Apply to affected skin twice daily for up to 14 days, Disp: 15 g, Rfl: 0    venlafaxine (EFFEXOR-XR) 150 mg 24 hr capsule, Take 1 capsule (150 mg total) by mouth daily, Disp: , Rfl:     venlafaxine (EFFEXOR-XR) 75 mg 24 hr capsule, Take 3 capsules (225 mg total) by mouth daily, Disp: , Rfl:     ROS:  Constitutional: No fevers, unintentional weight loss, or fatigue.   HEENT: No issues swallowing. No new lumps or bumps.  Cardiovascular: No chest pain.  Respiratory: No shortness of breath or cough.  Gastrointestinal: No abd pain or digestive problems.  MSK: No joint pain.  Skin: No rashes or bruising.   GU: No problems urinating.  Neurological: No focal weakness or numbness.  Psych: as in HPI     All other ROS reviewed and negative unless noted in HPI or above.    OBJECTIVE    PHYSICAL EXAM:  BP 107/76 (BP Location: Left upper arm, Patient Position: Sitting, Cuff Size: Adult)   Pulse 77   Ht 175.8 cm (5' 9.21")   Wt 82.6 kg (182 lb)   BMI 26.71 kg/m   Gen: Alert, comfortable, in no acute distress, cooperative and conversant.   Head: Normocephalic, atraumatic.  Eyes: Anicteric sclera. Lids and lashes normal, normal conjunctiva, EOMI, PERRL.  Ears: Normal pinnae, canals clear, bilat TMs grey with clear landmarks.  Nose: Nares normal, no drainage, turbinates without swelling or irritation.    Mouth: Lips, mucosa and tongue normal, no oropharyngeal lesions.  Neck: Supple.  No lymphadenopathy, no thyromegaly.    Lungs: Normal respiratory effort, speaking complete sentences. Clear to auscultation bilaterally, no crackles or wheezing.  Heart: Regular rate and rhythm, S1, S2 normal, no murmur, click, rub or gallop.  Abd: +BS, soft, non-tender. No guarding or rebound. No organomegaly.  Skin: No rashes, bruising, warm and dry with normal turgor.   Extr: No peripheral edema. Normal bulk and tone. +slight tremor with hands outstretched  GU: deferred  Neuro: CN II-XII intact. Normal speech.  Strength 5/5 x 4 extremities, DTR 2/4 bilat biceps and patella.   Psych: Normal mood and affect, fair insight and judgment.    ASSESSMENT AND PLAN    Todd Bates was seen today for annual exam and tremors.    Diagnoses and all orders for this visit:    Essential tremor  Diagnosed several years ago, onset with increasing dose of lamotrigine.  No s/sx suggesting insidious pathology at this time.  - trial of propranolol as below, wait until after Ziopatch study completed to start  - can increase dose or change formulation pending results and/or AEs  - RTC if new or worsening symptoms, no improvement, new neuro symptoms  -     propranoloL (INDERAL LA) 60 mg 24 hr capsule; Take 1 capsule (60 mg total) by mouth daily    Bipolar 2 disorder (CMS code)  Chronic, stable.  Following with outside psych and therapist.  Endorsing 1 episode SI without intent or plan.   - continue medications as prescribed  - safety planning done today  - consider if medical exception to stop working night shift would be helpful  - follow up PCP as needed    Intermittent palpitations  New since January, described as "skipped beats".  Exam WNL today, further workup indicated.  - labs as below  - Ziopatch study, to complete before starting propranolol  - reviewed s/sx needing urgent or emergent follow up   -     TSH; Future  -     Complete Blood Count with Differential; Future  -     Monitoring patch (Ziopatch) 5-14 day; Future    Well adult exam  Routine health exam completed  today.    - UTD on age appropriate screenings and immunizations after below  - encouraged to get 150 minutes of moderate exercise per week with at least 2 days of resistance training.   - encouraged diet rich in vegetables, fruits, whole grains, lean proteins including beans and legumes, nuts and  seeds  -     Lipid Panel (incl. LDL, HDL, Total Chol. and Trig.); Future  -     Hemoglobin A1c; Future  -     Comprehensive Metabolic Panel (BMP, AST, ALT, T.BILI, ALKP, TP, ALB); Future    Encounter for screening for lipid disorder  -     Lipid Panel (incl. LDL, HDL, Total Chol. and Trig.); Future    Encounter for screening for diabetes mellitus  -     Hemoglobin A1c; Future      The patient expressed understanding and agreement with the plan above.    Annette Stable, FNP-BC  12/03/22    I, Gary Fleet, NP, spent a total of 55 minutes on this patient's care on the day of their visit excluding time spent related to any billed procedures. This time includes time spent with the patient as well as time spent documenting in the medical record, reviewing patient's records and tests, obtaining history, placing orders, communicating with other healthcare professionals, counseling the patient, family, or caregiver, and/or care coordination for the diagnoses above.       This is an independent service.   The available consultant for this service is Oneita Jolly, MD.

## 2022-12-05 ENCOUNTER — Inpatient Hospital Stay: Admit: 2022-12-05 | Discharge: 2022-12-05 | Payer: PRIVATE HEALTH INSURANCE | Primary: General Practice

## 2022-12-05 ENCOUNTER — Ambulatory Visit: Admit: 2022-12-05 | Payer: PRIVATE HEALTH INSURANCE | Primary: General Practice

## 2022-12-05 DIAGNOSIS — Z0000 Encounter for general adult medical examination without abnormal findings: Secondary | ICD-10-CM

## 2022-12-05 DIAGNOSIS — R002 Palpitations: Secondary | ICD-10-CM

## 2022-12-05 LAB — COMPREHENSIVE METABOLIC PANEL (BMP, AST, ALT, T.BILI, ALKP, TP ALB)
AST: 24 U/L (ref 5–44)
Alanine transaminase: 22 U/L (ref 10–61)
Albumin, Serum / Plasma: 4.2 g/dL (ref 3.5–5.0)
Alkaline Phosphatase: 66 U/L (ref 38–108)
Anion Gap: 10 (ref 4–14)
Bilirubin, Total: 0.3 mg/dL (ref 0.2–1.2)
Calcium, total, Serum / Plasma: 9.4 mg/dL (ref 8.4–10.5)
Carbon Dioxide, Total: 28 mmol/L (ref 22–29)
Chloride, Serum / Plasma: 102 mmol/L (ref 101–110)
Creatinine: 1.07 mg/dL (ref 0.73–1.24)
Glucose, non-fasting: 84 mg/dL (ref 70–199)
Potassium, Serum / Plasma: 4.2 mmol/L (ref 3.5–5.0)
Protein, Total, Serum / Plasma: 6.9 g/dL (ref 6.3–8.6)
Sodium, Serum / Plasma: 140 mmol/L (ref 135–145)
Urea Nitrogen, serum/plasma: 13 mg/dL (ref 7–25)
eGFRcr: 96 mL/min/{1.73_m2} (ref >59)

## 2022-12-05 LAB — LIPID PANEL (INCL. LDL, HDL, TOTAL CHOL. AND TRIG.)
Chol HDL Ratio: 2.8 (ref <6.0)
Cholesterol, HDL: 60 mg/dL (ref >39)
Cholesterol, Total: 169 mg/dL (ref <200)
LDL Cholesterol: 106 mg/dL (ref <130)
Non HDL Cholesterol: 109 mg/dL (ref <160)
Triglycerides, serum: 17 mg/dL (ref <200)

## 2022-12-05 LAB — COMPLETE BLOOD COUNT WITH DIFFERENTIAL
Abs Basophils: 0.06 10*9/L (ref 0.00–0.10)
Abs Eosinophils: 0.16 10*9/L (ref 0.00–0.40)
Abs Imm Granulocytes: 0 10*9/L (ref <0.10)
Abs Lymphocytes: 1.75 10*9/L (ref 1.00–3.40)
Abs Monocytes: 0.42 10*9/L (ref 0.20–0.80)
Abs Neutrophils: 1.6 10*9/L — ABNORMAL LOW (ref 1.80–6.80)
Hematocrit: 37.5 % — ABNORMAL LOW (ref 41.0–53.0)
Hemoglobin: 12.8 g/dL — ABNORMAL LOW (ref 13.6–17.5)
MCH: 33.2 pg (ref 26.0–34.0)
MCHC: 34.1 g/dL (ref 31.0–36.0)
MCV: 97 fL (ref 80–100)
MPV: 10.1 fL (ref 9.1–12.6)
Platelet Count: 245 10*9/L (ref 140–450)
RBC Count: 3.86 10*12/L — ABNORMAL LOW (ref 4.40–5.90)
RDW-CV: 12.2 % (ref 11.7–14.4)
WBC Count: 4 10*9/L (ref 3.4–10.0)

## 2022-12-05 LAB — THYROID STIMULATING HORMONE: Thyroid Stimulating Hormone: 2.84 mIU/L (ref 0.45–4.12)

## 2022-12-06 LAB — HEMOGLOBIN A1C: Hemoglobin A1c: 4.7 PERCENT (ref 4.3–5.6)

## 2022-12-19 ENCOUNTER — Ambulatory Visit: Admit: 2022-12-19 | Payer: PRIVATE HEALTH INSURANCE | Primary: General Practice

## 2022-12-19 DIAGNOSIS — D649 Anemia, unspecified: Secondary | ICD-10-CM

## 2022-12-19 LAB — RETICULOCYTE COUNT: Retic Count, Flow Cytometry: 42.8 10*9/L (ref 29.0–121.4)

## 2022-12-19 LAB — FERRITIN: Ferritin, Serum/Plasma: 22 ug/L — ABNORMAL LOW (ref 38–270)

## 2022-12-19 LAB — IRON, % SATURATION AND TRANSFERRIN OR TIBC (DEPENDING ON THE LAB)
% Saturation: 24 % (ref 10–47)
Iron, serum: 91 ug/dL (ref 65–179)
Transferrin, Serum/Plasma: 266 mg/dL (ref 182–360)

## 2022-12-21 LAB — VITAMIN B12: Vitamin B12: 259 ng/L — ABNORMAL LOW (ref 301–816)

## 2022-12-22 LAB — FOLATE: Folate, serum: 14.9 ng/mL (ref >4)

## 2023-01-23 NOTE — Progress Notes (Signed)
RN please advise, please review pended rx for refill. Thank you.

## 2023-01-25 MED ORDER — PROPRANOLOL ER 60 MG CAPSULE,24 HR,EXTENDED RELEASE
60 mg | ORAL_CAPSULE | Freq: Every day | ORAL | 3 refills | Status: DC
Start: 2023-01-25 — End: 2023-09-28

## 2023-04-30 NOTE — Telephone Encounter (Signed)
Message being forwarded to provider:  FYI only     Situation (symptoms, c/o):  This 32 y.o. adult pt. is having "near-syncopal episodes" and "excessive sweating" starting 2 months prior.    Background (clinical history and diagnoses):    has a past medical history of Allergy, Anemia, Anxiety (06/2011), Asthma (12/2011), Depression (06/2011), Headache, Mental disorder (06/2011), Obesity (08/19/2021), and Sleep disorder (06/2011).    Assessment (of the triager):   All protocol questions were asked by RN. Only pertinent positives and pertinent negatives are documented    Patient reports they have experienced 2 episodes of near-syncope in the past 2 months, the most recent episode was during the weekend of 8/9. Patient describes that during the first episode, their heart rate per watch sensor was "up in the 150's", and the second episode heart rate was "in the 40's". Heart rate at baseline is reported as "high 60's". Patient reports there were no clear precipitating events prior to either event, they were walking from home to the pharmacy and deny significant exercise/exertion/dehydration in either case. Patient describes "every time I would blink, things would just get darker and darker". During both episodes, patient was able to sit down and did not lose consciousness, symptoms improved when patient "laid in bed" and "had granola bar and water". Patient denies severe stress, rapid changes in position, or prolonged standing prior to either episode.    Patient describes being "unable to regulate my temperature" and that they have been "sweating more than normal".     Patient denies shortness of breath, chest pain, bloody stool/vomit, recent fever or cold symptoms, or known sick contacts. Denies recent injury or trauma. Symptoms are not present at time of triage call.    Strict 911/ED precautions reviewed.     Recommendations:  If YES to any of the following, call 911 now:  No Still unconscious   No Still feel dizzy or  light-headed   No Difficult to awaken or acting confused (e.g., disoriented, slurred speech)  No Difficulty breathing  No Lips or face are bluish now   No Shock suspected (e.g., cold/pale/clammy skin, too weak to stand)  First aid: Lie down with the feet elevated.    No Bleeding (e.g., vomiting blood, rectal bleeding or tarry stools, severe vaginal bleeding)   No Chest pain  No Extra heartbeats or heart is beating fast (i.e., palpitations)  Yes Heart beating < 50 beats per minute or > 140 beats per minute  No Fainted suddenly after medicine, allergic food, or bee sting  No Sounds like a life-threatening emergency to the triager    If YES to any of the following, go to ED now:  No Fainted > 15 minutes ago and still looks pale (pale skin, pallor)  No Fainted > 15 minutes ago and still feels too weak or dizzy to stand  No History of heart problems or congestive heart failure  No Occurred during exercise   No Any head or face injury     If YES to any of the following, go to ED now (or to office with PCP approval):  No Age > 50 years   Reason: higher risk for cardiac disease and other serious causes of syncope   No Drinking very little and has signs of dehydration (e.g., no urine > 12 hours, very dry mouth)  No Fainted 2 times in 1 day  No Patient sounds very sick or weak to the triager    If YES to any of the  following, see today in office:  No All other patients, and now alert and feels fine   Exception: Simple faint due to stress, pain, prolonged standing, or suddenly standing    No Patient wants to be seen    If YES to any of the following, see within 2 weeks in office:  Yes Simple fainting is a chronic symptom (has occurred multiple times)    If YES to any of the following, home care:  No Sudden standing caused simple fainting, and now alert and feels fine   No Prolonged standing caused simple fainting, and now feels fine   No Fear, stress, or pain caused simple fainting, and now alert and feels fine    Education  and advice provided:  General Care Advice:  Treatment:  Lie down with feet elevated for 10 minutes (Reason: simple fainting is due to temporarily decreased blood flow to the brain).  Drink some fruit juice, especially if you have missed a meal or have not eaten in over 6 hours.    Expected Course:  Most adults with a simple faint are back to normal after lying down for 10 minutes.    Warning Symptoms for Fainting:  Fainting usually has early warning symptoms (e.g., dizziness, blurred vision, nausea, feeling cold or warm).  Lying down (even on the floor) is less embarrassing than fainting, no matter where you are.  If you feel these warning symptoms, immediately lie down to prevent falling down. You only have 5 seconds to act (Reason: almost impossible to faint when lying down).  Sitting down (with head between knees) is certainly better than staying standing up but is less effective than lying down.    Call back if:  You pass out again on the same day; you become worse.    Referred for appointment: Yes (scheduled for next-day in-person evaluation with ED precautions)    Verbalized Understanding:  Verbalizes understanding and intention to comply with instructions.    PER PROTOCOL FOR Fainting.   Cliffton Asters Janee Morn: Adult Telephone Triage, Copyright 2000-2013, 3rd Edition.

## 2023-05-01 ENCOUNTER — Ambulatory Visit: Admit: 2023-05-01 | Payer: PRIVATE HEALTH INSURANCE | Attending: Physician | Primary: General Practice

## 2023-05-01 DIAGNOSIS — R55 Syncope and collapse: Secondary | ICD-10-CM

## 2023-05-01 NOTE — Progress Notes (Unsigned)
Subjective    Todd Bates is a 32 y.o. nonbinary / gender queer person who presents with the following:           History of Present Illness   See Patient Message 04/30/23.    2 episodes of near-syncope in the past 2 months, the most recent episode was during the weekend of 8/9. Patient describes that during the first episode, their heart rate per watch sensor was "up in the 150's", and the second episode heart rate was "in the 40's". Heart rate at baseline is reported as "high 60's". Patient reports there were no clear precipitating events prior to either event, they were walking from home to the pharmacy and deny significant exercise/exertion/dehydration in either case. Patient describes "every time I would blink, things would just get darker and darker". During both episodes, patient was able to sit down and did not lose consciousness, symptoms improved when patient "laid in bed" and "had granola bar and water". Patient denies severe stress, rapid changes in position, or prolonged standing prior to either episode.     Patient describes being "unable to regulate my temperature" and that they have been "sweating more than normal".      Patient denies shortness of breath, chest pain, bloody stool/vomit, recent fever or cold symptoms, or known sick contacts. Denies recent injury or trauma.        Mid to late July -had left a therapy session, went to [facility] to get his medications, started a 7 block walk back to his apartment, when he started noticing that his eyes were blinking and things were getting dark around him.  After a couple of blocks he noted his heart racing and his heart rate according to his watch was 100 to 150.  He was able to continue to walk, was constantly blinking, and had a feeling that things were closing in on him.  He was able to make it home, and lay down to take a nap.  After he awoke, he felt okay.    Somewhere around August 9 to 11, he was walking in the [place] on a day when it was  not hot, but then he experienced profuse sweating, and then a similar feeling to the first episode when he was blinking a lot and feeling that things were getting dark.  This time, his heart rate was low in the 40s, she was walking with someone, so they went to [employer], sat on the bench, he ate a granola bar and drink some water, took Lyft home, and the symptoms eventually went away.  Total duration of symptoms about 1 hour.    Sense of doom or dread  ?dehydrated  Thyroid - fhx  H/o anemia  H/o anxiety - .  Panic attaks - usu heart rate high, controlled withmeds. (Ativan PRN) - usu more chest tightness    No face arm leg tingl numb wk                    Objective          Physical Exam        Review of Prior Testing       Assessment and Plan       {Assessment and plan options:32133}                    {Complexity of data reviewed (optional):28233::" "}    {Time spent options (optional):28235::" "}

## 2023-05-01 NOTE — Patient Instructions (Signed)
Blood tests have been ordered for you. Please visit one of the following locations for your labs. For your convenience, selected locations are accepting self-scheduled appointments through the MyChart website or app. If you are self-scheduling, download our special test instructions which have details about where and when various tests are done.    For updated information or more options, please visit us at:   [email or web address]    If there are 'FASTING LABS' (for example fasting metabolic panel, blood sugar), please DO NOT eat for at least 10 hours prior to the test. You CAN drink water and take medicines before the lab test.    [facility]  Laboratory Services (Outpatient)    [facility] (Appointments and Walk-Ins Welcomed)  [address], [address]st [address]  [place] [address]  Phone [phone]  Hours: Monday to Friday 7:30 am - 6:00 pm  Open on most Holidays 8:00 am - 4:30 pm, except Thanksgiving Day, Christmas Day, and New Year's Day      [facility] (Appointments and Walk-Ins Welcomed)  [address], [address]st [address] ([address])  [place] [address]  Phone [phone]  Hours: Monday to Friday 7:00 am - 5:30 pm  Closed on all UC holidays    [facility] (Appointments and Walk-Ins Welcomed)  [address], 3rd [place] [address]  Phone: [phone]  Hours: Monday to Friday 7:00 am - 5:30 pm  Closed on all UC holidays    [facility] (Walk-Ins Only)  [address], [place] [address]  Phone: [phone]  Hours: Monday to Friday 8:00 am - 5:00 pm  Closed on all UC holidays     [facility] (Appointments and Walk-Ins Welcomed)  [address] 2[address], [address], Free parking [place] [address]  Phone: [phone]  Hours: Monday to Friday 8:00 am - 12:00pm, 1:00 - 4:30  pm  Closed on all UC holidays

## 2023-05-05 ENCOUNTER — Ambulatory Visit: Admit: 2023-05-05 | Payer: PRIVATE HEALTH INSURANCE | Primary: General Practice

## 2023-05-05 DIAGNOSIS — R55 Syncope and collapse: Secondary | ICD-10-CM

## 2023-05-05 LAB — COMPLETE BLOOD COUNT
Hematocrit: 40.6 % — ABNORMAL LOW (ref 41.0–53.0)
Hemoglobin: 13.7 g/dL (ref 13.6–17.5)
MCH: 32.5 pg (ref 26.0–34.0)
MCHC: 33.7 g/dL (ref 31.0–36.0)
MCV: 96 fL (ref 80–100)
MPV: 10.3 fL (ref 9.1–12.6)
Platelet Count: 225 10*9/L (ref 140–450)
RBC Count: 4.22 10*12/L — ABNORMAL LOW (ref 4.40–5.90)
RDW-CV: 11.6 % — ABNORMAL LOW (ref 11.7–14.4)
WBC Count: 3.1 10*9/L — ABNORMAL LOW (ref 3.4–10.0)

## 2023-05-05 LAB — THYROID STIMULATING HORMONE: Thyroid Stimulating Hormone: 1.67 mIU/L (ref 0.45–4.12)

## 2023-08-07 NOTE — Telephone Encounter (Signed)
Message being forwarded to provider:  FYI only     Situation (symptoms, c/o):  This 32 y.o. adult pt. is having shortness of breath when dancing and lifting weights starting about a month ago.    Background (clinical history and diagnoses):    has a past medical history of Allergy, Anemia, Anxiety (06/2011), Asthma (12/2011), Depression (06/2011), Headache, Mental disorder (06/2011), Obesity (08/19/2021), and Sleep disorder (06/2011).    Assessment (of the triager):   All protocol questions were asked by RN. Only pertinent positives and pertinent negatives are documented    Spoke to the patient. Patient states that they started getting short of breath about a month ago while exercising - specifically weight-training or dancing. Patient feels like they are having to make more and more effort to get through their classes, they are tachycardic and they can hardly catch his breath. Of note, they did have exercise-induced asthma in college that felt a lot like these present symptoms but it hadn't been an issue in years. They are not using an inhaler. No exposure to any allergens that they are aware of. Walking doesn't make them short of breath.     Patient did have a tickle in their throat and some sniffles about three weeks ago but these symptoms have resolved now. They aren't coughing up any phlegm or mucus. No headaches. No rashes. No fevers, chills or shaking.     Patient has no issues with eating, drinking, voiding and passing stool.    Patient is going to avoid the exercises that seem to cause the shortness of breath and tachycardia meantime. They already have an appointment next Wednesday with Annette Stable. I did try to find an appointment on Monday or Tuesday but everything is booked up already. I asked if they wanted to go to Urgent Care today or over the weekend and they declined. I told them I would send some home care instructions and a list of our Urgent Cares locally just so they have them  handy.        Recommendations:  If YES to any of the following, call 911 now:  No Breathing stopped and hasnt returned.   FIRST AID: begin mouth-to-mouth breathing.  No Choking on something.  FIRST AID: If breathing stopped, discuss the abdominal thrust maneuver  No SEVERE difficulty breathing (e.g. struggling for each breath, speaks in single words, pulse >120)  No Bluish lips, tongue, or face now  No Difficult to awaken or acting confused, disoriented, speech slurred  No Passed out  No Wheezing started suddenly after medicine, an allergic food, or bee sting  No Stridor  No Slow, shallow, and weak breathing  No Sounds like a life-threatening emergency to the triager    If YES to any of the following, go to the ED now:  Yes MODERATE difficulty breathing (speaks in phrases, SOB even at rest, pulse 100-120)  No Wheezing can be heard across the room  No Drooling or spitting out saliva (because cant swallow)  No Any history of blood clot in leg or lungs  No Recent illness requiring prolonged bed rest  No Hip or leg fracture in the past 2 months  No Major surgery in the past month  No Recent long-distance travel with prolonged time in car, bus, plane, or train  No Extra heart beats OR irregular heart beating      If YES to any of the following, go to the ED now (or to office with PCP approval):  No  Fever >103F  No Fever >100.5 and over 25 years of age  No Fever >100.5 and bedridden  No Fever >100.5 and has diabetes or weakened immune system  No Periods where breathing stops and then resumes normally and bedridden  No Pregnant or postpartum (<1 month since delivery)  No Patient sounds very sick or weak to triager    If YES to any of the following, go to the office now:  No MILD difficulty breathing (minimal, no SOB at rest, SOB with walking, pulse <100)  No Worsening difficulty breathing and not responding to usual therapy  No Continuous (nonstop) coughing  Yes Patient wants to be seen      Advice provided (pending  office visit):  Call back if you become worse     First Aid:  If not breathing: call 911 and start CPR if you know how (30 chest compressions then mouth-to-mouth with 2 breaths and repeat; this will equal about 100 compressions per minute and approx. 6 breaths).  For anaphylaxis: call 911 and while waiting for EMS arrival. If the patient has an EpiPen (epinephrine autoinjector), follow the provider's or pharmacist's instructions for use (like use upper outer thigh, may give it through clothing if needed).  For anaphylaxis: talk with pharmacist about what antihistamines are available to take orally now.  For choking: if coughing and breathing, encourage coughing; do not offer anything to drink. If breathing stops: perform abdominal thrust (Heimlich maneuver): grasp the adult from behind, just below the lower ribs but above the navel, in a bear-hug fashion. Make a fist with one hand and fold the other hand over it. Give a sudden upward and backward jerk (at a 45 degree angle) to try to squeeze all the air out of the chest and pop the lodged object out of the windpipe. If the adult is too heavy for you to suspend from your arms, lay pt. down on his/her back. Put your hands on both sides of the abdomen, just below the ribs. And apply sudden, strong bursts of upward pressure.    Referred for appointment: Yes      Verbalized Understanding:  Verbalizes understanding and intention to comply with instructions.    PER PROTOCOL FOR BREATHING DIFFICULTY Adult Telephone Triage, Copyright 2000-2013 Onalee Hua A. Janee Morn  *Use this protocol if breathing difficulty is the patients only symptom*

## 2023-08-17 ENCOUNTER — Ambulatory Visit: Admit: 2023-08-17 | Payer: PRIVATE HEALTH INSURANCE | Attending: General Practice | Primary: General Practice

## 2023-08-17 ENCOUNTER — Ambulatory Visit: Admit: 2023-08-17 | Payer: PRIVATE HEALTH INSURANCE | Primary: General Practice

## 2023-08-17 ENCOUNTER — Inpatient Hospital Stay: Admit: 2023-08-17 | Payer: PRIVATE HEALTH INSURANCE | Primary: General Practice

## 2023-08-17 DIAGNOSIS — R0602 Shortness of breath: Secondary | ICD-10-CM

## 2023-08-17 LAB — COMPREHENSIVE METABOLIC PANEL (BMP, AST, ALT, T.BILI, ALKP, TP ALB)
AST: 18 U/L (ref 5–44)
Alanine transaminase: 29 U/L (ref 10–61)
Albumin, Serum / Plasma: 4.4 g/dL (ref 3.5–5.0)
Alkaline Phosphatase: 69 U/L (ref 38–108)
Anion Gap: 8 (ref 4–14)
Bilirubin, Total: 0.7 mg/dL (ref 0.2–1.2)
Calcium, total, Serum / Plasma: 9.7 mg/dL (ref 8.4–10.5)
Carbon Dioxide, Total: 28 mmol/L (ref 22–29)
Chloride, Serum / Plasma: 102 mmol/L (ref 101–110)
Creatinine: 1.17 mg/dL (ref 0.73–1.24)
Glucose, non-fasting: 85 mg/dL (ref 70–199)
Potassium, Serum / Plasma: 4.7 mmol/L (ref 3.5–5.0)
Protein, Total, Serum / Plasma: 7.3 g/dL (ref 6.3–8.6)
Sodium, Serum / Plasma: 138 mmol/L (ref 135–145)
Urea Nitrogen, serum/plasma: 13 mg/dL (ref 7–25)
eGFRcr: 86 mL/min/{1.73_m2} (ref >59)

## 2023-08-17 LAB — COMPLETE BLOOD COUNT WITH DIFFERENTIAL
Abs Basophils: 0.05 10*9/L (ref 0.00–0.10)
Abs Eosinophils: 0.14 10*9/L (ref 0.00–0.40)
Abs Imm Granulocytes: 0.01 10*9/L (ref <0.10)
Abs Lymphocytes: 2.07 10*9/L (ref 1.00–3.40)
Abs Monocytes: 0.38 10*9/L (ref 0.20–0.80)
Abs Neutrophils: 1.23 10*9/L — ABNORMAL LOW (ref 1.80–6.80)
Hematocrit: 40.2 % — ABNORMAL LOW (ref 41.0–53.0)
Hemoglobin: 14.2 g/dL (ref 13.6–17.5)
MCH: 33.1 pg (ref 26.0–34.0)
MCHC: 35.3 g/dL (ref 31.0–36.0)
MCV: 94 fL (ref 80–100)
MPV: 10.4 fL (ref 9.1–12.6)
Platelet Count: 243 10*9/L (ref 140–450)
RBC Count: 4.29 10*12/L — ABNORMAL LOW (ref 4.40–5.90)
RDW-CV: 11.4 % — ABNORMAL LOW (ref 11.7–14.4)
WBC Count: 3.9 10*9/L (ref 3.4–10.0)

## 2023-08-17 LAB — THYROID STIMULATING HORMONE: Thyroid Stimulating Hormone: 3.34 m[IU]/L (ref 0.45–4.12)

## 2023-08-18 LAB — ECG 12-LEAD
Atrial Rate: 64 {beats}/min
Atrial Rate: 64 {beats}/min
Calculated P Axis: 49 degrees
Calculated P Axis: 58 degrees
Calculated R Axis: 74 degrees
Calculated R Axis: 77 degrees
Calculated T Axis: 70 degrees
Calculated T Axis: 70 degrees
P-R Interval: 134 ms
P-R Interval: 138 ms
QRS Duration: 94 ms
QRS Duration: 96 ms
QT Interval: 394 ms
QT Interval: 394 ms
QTcb: 406 ms
QTcb: 406 ms
Ventricular Rate: 64 {beats}/min
Ventricular Rate: 64 {beats}/min

## 2023-08-20 ENCOUNTER — Ambulatory Visit: Admit: 2023-08-20 | Discharge: 2023-08-25 | Payer: PRIVATE HEALTH INSURANCE | Primary: General Practice

## 2023-08-20 DIAGNOSIS — R0602 Shortness of breath: Secondary | ICD-10-CM

## 2023-08-21 LAB — ECG 12-LEAD
Atrial Rate: 73 {beats}/min
Calculated P Axis: 68 degrees
Calculated R Axis: 89 degrees
Calculated T Axis: 81 degrees
P-R Interval: 132 ms
QRS Duration: 94 ms
QT Interval: 382 ms
QTcb: 420 ms
Ventricular Rate: 73 {beats}/min

## 2023-08-21 LAB — ECG STRESS REPORT
Max Diastolic BP: 63 mmHg
Max Heart Rate: 193 {beats}/min
Max Predicted Heart Rate: 190 {beats}/min
Max Work Load (METS*10): 117
Max. Systolic BP: 151 mmHg

## 2023-08-22 MED ORDER — ALBUTEROL SULFATE HFA 90 MCG/ACTUATION AEROSOL INHALER
90 | RESPIRATORY_TRACT | 3 refills | 22.50000 days | Status: DC | PRN
Start: 2023-08-22 — End: 2024-06-08

## 2023-08-31 ENCOUNTER — Ambulatory Visit
Admit: 2023-08-31 | Discharge: 2023-08-31 | Payer: PRIVATE HEALTH INSURANCE | Attending: Student in an Organized Health Care Education/Training Program | Primary: General Practice

## 2023-08-31 DIAGNOSIS — J029 Acute pharyngitis, unspecified: Secondary | ICD-10-CM

## 2023-08-31 LAB — POCT FLU A AND B RNA, QUAL RAPID (DEVICE)
Influenza A POC: NOT DETECTED
Influenza B POC: NOT DETECTED

## 2023-08-31 LAB — POCT COVID-19 RNA, QUALITATIVE RAPID: POCT COVID-19 RNA, Qualitative, Rapid: NOT DETECTED

## 2023-08-31 NOTE — Progress Notes (Signed)
 URGENT CARE PHYSICIAN NOTE   Todd Bates is a 32 y.o. old adult, with bipolar, essential tremor presenting for sore throat x 2 days.     Sore throat, congestion, body aches for 2 days. Coughing too. Felt warm.     Partner is sick, having yellow/green sputum.     Took some tylenol 1500 230AM, which helped. Cold/flu syrup.    No shortness of breath.     Allergies/Contraindications   Allergen Reactions    Fig Hives, Itching and Rash    Quince Seed Hives, Itching and Rash       Past Medical History:   Diagnosis Date    Allergy     Anemia     Anxiety 06/2011    Asthma 12/2011    Hx of exercise induced asthma in college    Depression 06/2011    Headache     Mental disorder 06/2011    Obesity 08/19/2021    Sleep disorder 06/2011      Past Surgical History:   Procedure Laterality Date    TONSILLECTOMY  09/2017     Family History   Problem Relation Name Age of Onset    Anxiety disorder Mother Elnita Maxwell     Depression Mother Elnita Maxwell     Kidney cancer Mother Elnita Maxwell         Kidney removed due to cancer in early 20s    Bipolar disorder Mother Elnita Maxwell     Mental illness Father Jomarie Longs         Most likely undiagnosed    Anxiety disorder Sister Cala Bradford     Depression Sister Cala Bradford     Bipolar disorder Sister Cala Bradford     Diabetes Maternal Grandmother Marjorie     Thyroid disease Maternal Grandmother Marjorie     Alcohol abuse Maternal Grandfather James     Bleeding disorder Maternal Grandfather Fayrene Fearing     Lung cancer Maternal Grandfather Fayrene Fearing     Dementia Maternal Grandfather James     High blood pressure Maternal Cynda Familia     Mental illness Maternal Cynda Familia         Most likely undiagnosed    Stroke Maternal Cynda Familia     Coronary Artery Disease Paternal Grandmother          Had a bypass    Colon cancer Paternal Grandfather Clyde     Thyroid cancer Paternal Theador Hawthorne     Coronary Artery Disease Paternal Theador Hawthorne         Had a bypass       Social History     Socioeconomic History     Marital status: Single   Tobacco Use    Smoking status: Some Days     Current packs/day: 0.00     Average packs/day: 0.3 packs/day for 2.2 years (0.5 ttl pk-yrs)     Types: Cigarettes, Vape     Start date: 06/30/2017     Last attempt to quit: 09/09/2019     Years since quitting: 3.9    Smokeless tobacco: Never   Substance and Sexual Activity    Alcohol use: Yes     Alcohol/week: 1.0 standard drink of alcohol     Types: 1 Glasses of wine per week     Comment: occasionally    Drug use: Not Currently     Types: Amyl nitrate    Sexual activity: Yes     Partners: Male   Social History Narrative    Single, 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   HENT:      Ears:      Comments: Mild-mod TM effusion bilaterally     Mouth/Throat:      Comments: Erythematous throat, no tonsillar swelling/exudates  Cardiovascular:      Rate and Rhythm: Normal rate and regular rhythm.   Pulmonary:      Effort: Pulmonary effort is normal. No respiratory distress.      Breath sounds: Normal breath sounds. No stridor. No wheezing, rhonchi or rales.   Lymphadenopathy:      Cervical: Cervical adenopathy present.   Neurological:      General: No focal deficit present.      Mental Status: Leighton is alert and oriented to person, place, and time.   Psychiatric:         Mood and Affect: Mood normal.         Behavior: Behavior normal.         MEDICAL DECISION MAKING     Differential Diagnosis:     Sore throat  Sore throat, cough, congestion, body aches x 2 days, partner also sick at home. Exam without tonsillar swelling/exudates, just erythematous throat  Most likely viral etiology of symptoms (URI/pharyngitis). CENTOR 1, low suspicion so opted to not test for strep this time. Low suspicion for STI pharyngitis. No signs of RPA/PTA. COVID/flu  negative today. 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   Advised conservative management w antitussives and antipyretics. Counseled on expected timeline.   Return precautions given.   -     POC FLU A and B RNA, Qualitative Rapid  -     POCT COVID-19 RNA, Qualitative, Rapid  -     Respiratory Viral Panel PCR; Future

## 2023-08-31 NOTE — Patient Instructions (Addendum)
 Cough:  - guaifensin (robitussion): cough expectorant  - dextromethorphan (mucinex): cough suppressant  - continue honey, green tea  - cough drops    Sore throat:  - salt water gargles    Sinuses:  - cetirizine daily  - (AFTER SICK): flonase nasal spray daily for at least 3-4 days, takes a while to work  - sudafed (dries up sinuses): can make people hyperactive, so try during day first bc it might make it harder to sleep    Fever/body aches/chills:  - tylenol 1000 every 6 hours  - ibuprofen 600 every 6 hours   (At peak illness can alternate these. For example tylenol at 12, then ibuprofen at 3, then tylenol at 6, ibuprofen at 9, etc.)    Recommendations:  DURING SICKNESS: tylenol/ibuprofen ATC  POST VIRAL COUGH: Definitely start the cetirizine and flonase every day, give them a 3-4 days to start working, ideally taking it for at least a week. Can stop taking once feeling better. This tackles the underlying sinus congestion, postnasal drip, and even possible allergic component that can be contributing to your cough. All the other cough medicines, you can try separately, or all at once per your preference.     (Of note: dayquil and nyquil are just combinations of these medications, I would actually just take each medication separately rather than in these combinations because then you can control the amount of what you're taking).     Please return if sore throat continues to worsen after 3-4 days.

## 2023-09-01 LAB — RESPIRATORY VIRAL PANEL PCR
Adenovirus DNA: NOT DETECTED
Influenza A (H1) RNA: NOT DETECTED
Influenza A (H3) RNA: NOT DETECTED
Influenza A RNA: NOT DETECTED
Influenza B RNA: NOT DETECTED
Metapneumovirus RNA: NOT DETECTED
Parainfluenza 1 RNA: NOT DETECTED
Parainfluenza 2 RNA: NOT DETECTED
Parainfluenza 3 RNA: NOT DETECTED
RSV A RNA: NOT DETECTED
RSV B RNA: NOT DETECTED
Rhinovirus RNA: DETECTED — AB

## 2023-09-01 NOTE — Telephone Encounter (Signed)
 Post discharge follow up call for wellness check and AVS review.    Tawanna Sat RN CNIII  Urgent Care- Coordination Team  [facility]  [phone]  [phone]

## 2023-09-17 ENCOUNTER — Ambulatory Visit: Admit: 2023-09-17 | Payer: PRIVATE HEALTH INSURANCE | Attending: General Practice | Primary: General Practice

## 2023-09-17 DIAGNOSIS — R0602 Shortness of breath: Secondary | ICD-10-CM

## 2023-09-17 NOTE — Progress Notes (Signed)
SUBJECTIVE    Todd Bates is a 33 y.o. adult complaining of Follow-up (Follow up on test results.) and Referral (Cardiology)      HPI:    #shortness of breath and tachycardia  08/17/23 visit:  Cold on 11/16-17, gone by 11/19  Since then, they really can't catch their breath when working out  [employer] like they can't expand their lungs to get a good breath  Doesn't hurt, just can't get a good breath and feeling nauseated  They were having some nausea while working out before getting sick, that has also continued  Any amount of working working out will do it  OK to walk up stairs and a steep hill, but dancing and weights will do it  When it happens, also has a bit of dizziness  HR is in 150s, but doesn't feel palpitations or chest pain   No more sweating than usual  No cough, no wheezing   Had a near syncope episode in August, not the same symptoms     Interval hx:  No real improvement in symptoms, has essentially stopped exercising   They've been trying to pay close attention to what their heart is doing  Noted several episodes of tachy HR, 120s, even at rest  Walking up stairs the other day and felt like they couldn't make it up the 3rd flight  Tried albuterol before working out, didn't see any improvement in symptoms  Nausea continues, but only when heart rate is up  No chest pain, no sweating   Happening at least daily now      Current Outpatient Medications:     albuterol 90 mcg/actuation metered dose inhaler, Inhale 2 puffs into the lungs every 4 (four) hours as needed for Wheezing, Disp: 1 each, Rfl: 3    APRETUDE 600 mg/3 mL (200 mg/mL) injection, Inject into the muscle, Disp: , Rfl:     doxepin (SINEQUAN) 25 mg capsule, Take 3 capsules (75 mg total) by mouth nightly at bedtime, Disp: , Rfl:     lamoTRIgine (LAMICTAL) 200 mg tablet, Take 1 tablet (200 mg total) by mouth daily, Disp: , Rfl:     LORazepam (ATIVAN) 1 mg tablet, Take 1 tablet (1 mg total) by mouth daily, Disp: , Rfl:     multivitamin tablet, Take  by mouth, Disp: , Rfl:     propranoloL (INDERAL LA) 60 mg 24 hr capsule, Take 1 capsule (60 mg total) by mouth daily, Disp: 90 capsule, Rfl: 3    venlafaxine (EFFEXOR-XR) 150 mg 24 hr capsule, Take 1 capsule (150 mg total) by mouth daily, Disp: , Rfl:     doxepin (SINEQUAN) 10 mg capsule, , Disp: , Rfl:     ROS: as in HPI    OBJECTIVE    PHYSICAL EXAM:  BP 110/75 (BP Location: Right upper arm, Patient Position: Sitting, Cuff Size: Adult)   Pulse 62   Ht 175.8 cm (5' 9.21")   Wt 81.9 kg (180 lb 8.9 oz)   BMI 26.50 kg/m   Gen: Alert, comfortable, in no acute distress, cooperative and conversant.   Head: Normocephalic, atraumatic.  Eyes: Anicteric sclera. Lids and lashes normal, normal conjunctiva, EOMI.  Nose: Nares normal, no drainage.    Mouth: Lips, mucosa and tongue normal.  Neck: Supple.  No lymphadenopathy, no thyromegaly.   Lungs: Normal respiratory effort, speaking complete sentences. Clear to auscultation bilaterally, no crackles or wheezing.  Heart: Regular rate and rhythm, S1, S2 normal, no murmur, click, rub or gallop.  Abd: +BS, soft, non-tender. No guarding or rebound. No organomegaly.  Skin: Warm and dry with normal turgor.   Extr: No peripheral edema. Normal bulk and tone.   Neuro: CN II-XII grossly intact. Normal speech.     Psych: Normal mood and affect, fair insight and judgment.    08/20/23 Stress Echo and ECG:  Summary:  1. Negative maximal stress test without echocardiographic evidence for inducible ischemia.  2.The blood pressure response was normal. The functional capacity is average for patient's age.  3.Global LV function augments normally at peak stress. ECG results are reported separately.    CONCLUSION:  1. The ECG portion of this test is negative for exercise induced ischemia.   2. Exercise was at a high workload and high double product.      ASSESSMENT AND PLAN    Sunshine was seen today for follow-up and referral.    Diagnoses and all orders for this visit:    Exertional shortness  of breath  Tachycardia  Symptoms persist with limited improvement.  Now also with resting episodes of tachycardia.  Stress TTE and ECG WNL, as is exam and previous ECG.  Had Zio in April 2024, but worth repeating at this time.  - Ziopatch to catch episodes   - as now having tachy episodes at rest, needs cardiologist assessment, appreciate specialist recommendations  - reviewed s/sx needing urgent or emergent follow up   - follow up after Zio  -     Monitoring patch (Ziopatch) 5-14 day; Future  -     Referral to Cardiology & Subspecialties - Destination: [facility]; Has the patient established care with a general cardiologist? No; Subspecialty: General Cardiology; Reason: Other (elaborate in clinical question); Future      The patient expressed understanding and agreement with the plan above.    Annette Stable, FNP-BC  09/17/23       APP Visit Information:   APP Service Type:  Independent  Available MD consultant:  Clide Dales Renetta Chalk, MD

## 2023-09-25 ENCOUNTER — Inpatient Hospital Stay: Admit: 2023-09-25 | Discharge: 2023-09-25 | Payer: PRIVATE HEALTH INSURANCE | Primary: General Practice

## 2023-09-25 DIAGNOSIS — R0602 Shortness of breath: Secondary | ICD-10-CM

## 2023-09-27 ENCOUNTER — Ambulatory Visit
Admit: 2023-09-27 | Discharge: 2023-09-27 | Payer: PRIVATE HEALTH INSURANCE | Attending: Physician | Primary: General Practice

## 2023-09-27 ENCOUNTER — Inpatient Hospital Stay: Admit: 2023-09-27 | Payer: PRIVATE HEALTH INSURANCE | Primary: General Practice

## 2023-09-27 DIAGNOSIS — R1031 Right lower quadrant pain: Secondary | ICD-10-CM

## 2023-09-27 LAB — COMPREHENSIVE METABOLIC PANEL (BMP, AST, ALT, T.BILI, ALKP, TP ALB)
AST: 13 U/L (ref 5–44)
Alanine transaminase: 12 U/L (ref 10–61)
Albumin, Serum / Plasma: 4.3 g/dL (ref 3.5–5.0)
Alkaline Phosphatase: 65 U/L (ref 38–108)
Anion Gap: 11 (ref 4–14)
Bilirubin, Total: 0.8 mg/dL (ref 0.2–1.2)
Calcium, total, Serum / Plasma: 9.4 mg/dL (ref 8.4–10.5)
Carbon Dioxide, Total: 27 mmol/L (ref 22–29)
Chloride, Serum / Plasma: 100 mmol/L — ABNORMAL LOW (ref 101–110)
Creatinine: 1.06 mg/dL (ref 0.73–1.24)
Glucose, non-fasting: 101 mg/dL (ref 70–199)
Potassium, Serum / Plasma: 4.1 mmol/L (ref 3.5–5.0)
Protein, Total, Serum / Plasma: 7.8 g/dL (ref 6.3–8.6)
Sodium, Serum / Plasma: 138 mmol/L (ref 135–145)
Urea Nitrogen, serum/plasma: 9 mg/dL (ref 7–25)
eGFRcr: 97 mL/min/{1.73_m2} (ref >59)

## 2023-09-27 LAB — COMPLETE BLOOD COUNT WITH DIFFERENTIAL
Abs Basophils: 0.02 10*9/L (ref 0.00–0.10)
Abs Eosinophils: 0.1 10*9/L (ref 0.00–0.40)
Abs Imm Granulocytes: 0.01 10*9/L (ref <0.10)
Abs Lymphocytes: 1.62 10*9/L (ref 1.00–3.40)
Abs Monocytes: 0.62 10*9/L (ref 0.20–0.80)
Abs Neutrophils: 3.72 10*9/L (ref 1.80–6.80)
Hematocrit: 39.3 % — ABNORMAL LOW (ref 41.0–53.0)
Hemoglobin: 13.7 g/dL (ref 13.6–17.5)
MCH: 32.2 pg (ref 26.0–34.0)
MCHC: 34.9 g/dL (ref 31.0–36.0)
MCV: 93 fL (ref 80–100)
MPV: 10.1 fL (ref 9.1–12.6)
Platelet Count: 280 10*9/L (ref 140–450)
RBC Count: 4.25 10*12/L — ABNORMAL LOW (ref 4.40–5.90)
RDW-CV: 12.2 % (ref 11.7–14.4)
WBC Count: 6.1 10*9/L (ref 3.4–10.0)

## 2023-09-27 LAB — LIPASE: Lipase: 26 U/L (ref 8–78)

## 2023-09-27 LAB — URINALYSIS WITH REFLEX TO CULTURE
Bilirubin, Urine: NEGATIVE
Glucose, (UA): NEGATIVE mg/dL
Hemoglobin (UA): NEGATIVE
Hyaline Cast: >10
Ketones, UA: NEGATIVE mg/dL
Nitrite: NEGATIVE
Protein, UA: 30 mg/dL — AB
Specific Gravity: 1.027 (ref 1.002–1.030)
Squam Epith Cells: >10 /LPF
Urobilinogen: 2 mg/dL — AB
WBC Esterase: NEGATIVE
WBCs, UR: 5 /HPF (ref <5)
pH, UA: 6.5 (ref 4.5–8.0)

## 2023-09-27 MED ORDER — SODIUM CHLORIDE 0.9 % (FLUSH) INJECTION SYRINGE
0.9 | INTRAMUSCULAR | Status: DC | PRN
Start: 2023-09-27 — End: 2023-09-28

## 2023-09-27 MED ORDER — ONDANSETRON HCL (PF) 4 MG/2 ML INJECTION SOLUTION
4 | INTRAMUSCULAR | Status: DC | PRN
Start: 2023-09-27 — End: 2023-09-27

## 2023-09-27 MED ORDER — LORAZEPAM 1 MG TABLET
1 | Freq: Every day | ORAL | Status: DC
Start: 2023-09-27 — End: 2023-09-28
  Administered 2023-09-27 – 2023-09-28 (×2): 1 mg via ORAL

## 2023-09-27 MED ORDER — SODIUM CHLORIDE 0.9 % (FLUSH) INJECTION SYRINGE
0.9 | Freq: Two times a day (BID) | INTRAMUSCULAR | Status: DC
Start: 2023-09-27 — End: 2023-09-28
  Administered 2023-09-27 – 2023-09-28 (×2): 3 mL via INTRAVENOUS

## 2023-09-27 MED ORDER — SENNOSIDES 8.6 MG TABLET
8.6 | Freq: Every day | ORAL | Status: DC
Start: 2023-09-27 — End: 2023-09-28

## 2023-09-27 MED ORDER — ELECTROLYTE-A INTRAVENOUS SOLUTION
INTRAVENOUS | Status: DC
Start: 2023-09-27 — End: 2023-09-28
  Administered 2023-09-27 – 2023-09-28 (×3): 75 mL/h via INTRAVENOUS

## 2023-09-27 MED ORDER — CEFTRIAXONE 1 GRAM SOLUTION FOR INJECTION
1 | INTRAMUSCULAR | Status: AC
Start: 2023-09-27 — End: 2023-09-27
  Administered 2023-09-27: 22:00:00 1 g via INTRAVENOUS

## 2023-09-27 MED ORDER — DOXEPIN 75 MG CAPSULE
75 | Freq: Every day | ORAL | Status: DC
Start: 2023-09-27 — End: 2023-09-27

## 2023-09-27 MED ORDER — PROPRANOLOL ER 60 MG CAPSULE,24 HR,EXTENDED RELEASE
60 | Freq: Every day | ORAL | Status: DC
Start: 2023-09-27 — End: 2023-09-28
  Administered 2023-09-27 – 2023-09-28 (×2): 60 mg via ORAL

## 2023-09-27 MED ORDER — METRONIDAZOLE 500 MG/100 ML IN SODIUM CHLOR(ISO) INTRAVENOUS PIGGYBACK
500 | Freq: Three times a day (TID) | INTRAVENOUS | Status: AC
Start: 2023-09-27 — End: 2023-09-27
  Administered 2023-09-28: 500 mg via INTRAVENOUS

## 2023-09-27 MED ORDER — VENLAFAXINE ER 150 MG CAPSULE,EXTENDED RELEASE 24 HR
150 | Freq: Every day | ORAL | Status: DC
Start: 2023-09-27 — End: 2023-09-28
  Administered 2023-09-27 – 2023-09-28 (×2): 150 mg via ORAL

## 2023-09-27 MED ORDER — IOHEXOL 350 MG IODINE/ML INTRAVENOUS SOLUTION
350 | Freq: Once | INTRAVENOUS | Status: AC
Start: 2023-09-27 — End: 2023-09-27
  Administered 2023-09-27: 18:00:00 150 mL via INTRAVENOUS

## 2023-09-27 MED ORDER — ALBUTEROL SULFATE HFA 90 MCG/ACTUATION AEROSOL INHALER
90 | RESPIRATORY_TRACT | Status: DC | PRN
Start: 2023-09-27 — End: 2023-09-28

## 2023-09-27 MED ORDER — ONDANSETRON HCL 4 MG TABLET
4 | Freq: Four times a day (QID) | ORAL | Status: DC | PRN
Start: 2023-09-27 — End: 2023-09-28
  Administered 2023-09-28: 05:00:00 4 mg via ORAL

## 2023-09-27 MED ORDER — LAMOTRIGINE 200 MG TABLET
200 | Freq: Every day | ORAL | Status: DC
Start: 2023-09-27 — End: 2023-09-28
  Administered 2023-09-27 – 2023-09-28 (×2): 200 mg via ORAL

## 2023-09-27 MED ORDER — ONDANSETRON HCL (PF) 4 MG/2 ML INJECTION SOLUTION
4 | Freq: Four times a day (QID) | INTRAMUSCULAR | Status: DC | PRN
Start: 2023-09-27 — End: 2023-09-28

## 2023-09-27 MED ORDER — HYDROMORPHONE (PF) 0.5 MG/0.5 ML INJECTION SYRINGE
0.5 | INTRAMUSCULAR | Status: DC | PRN
Start: 2023-09-27 — End: 2023-09-27

## 2023-09-27 MED ORDER — NALOXONE 0.4 MG/ML INJECTION SOLUTION
0.4 | INTRAMUSCULAR | Status: DC | PRN
Start: 2023-09-27 — End: 2023-09-28

## 2023-09-27 MED ORDER — OXYCODONE 5 MG TABLET
5 | ORAL | Status: DC | PRN
Start: 2023-09-27 — End: 2023-09-28
  Administered 2023-09-28 (×2): 10 mg via ORAL
  Administered 2023-09-28: 02:00:00 5 mg via ORAL
  Administered 2023-09-28: 05:00:00 10 mg via ORAL

## 2023-09-27 MED ORDER — BISACODYL 10 MG RECTAL SUPPOSITORY
10 | Freq: Every day | RECTAL | Status: DC | PRN
Start: 2023-09-27 — End: 2023-09-28

## 2023-09-27 MED ORDER — DOXEPIN 25 MG CAPSULE
25 | Freq: Every day | ORAL | Status: DC
Start: 2023-09-27 — End: 2023-09-28
  Administered 2023-09-28: 05:00:00 25 mg via ORAL

## 2023-09-27 MED ORDER — IBUPROFEN 600 MG TABLET
600 | Freq: Three times a day (TID) | ORAL | Status: DC
Start: 2023-09-27 — End: 2023-09-28
  Administered 2023-09-27 – 2023-09-28 (×3): 600 mg via ORAL

## 2023-09-27 MED ORDER — LIDOCAINE 4 % TOPICAL PATCH
4 | Freq: Every day | TOPICAL | Status: DC
Start: 2023-09-27 — End: 2023-09-28
  Administered 2023-09-27: 22:00:00 1 via TOPICAL

## 2023-09-27 MED ORDER — ACETAMINOPHEN 500 MG TABLET
500 | Freq: Three times a day (TID) | ORAL | Status: DC
Start: 2023-09-27 — End: 2023-09-28
  Administered 2023-09-27 – 2023-09-28 (×3): 1000 mg via ORAL

## 2023-09-27 MED ORDER — POLYETHYLENE GLYCOL 3350 17 GRAM ORAL POWDER PACKET
17 | Freq: Every day | ORAL | Status: DC | PRN
Start: 2023-09-27 — End: 2023-09-28

## 2023-09-27 MED ORDER — NALOXONE 0.4 MG/ML INJECTION SOLUTION
0.4 | INTRAMUSCULAR | Status: DC | PRN
Start: 2023-09-27 — End: 2023-09-27

## 2023-09-27 MED ORDER — HYDROMORPHONE (PF) 0.5 MG/0.5 ML INJECTION SYRINGE
0.5 | INTRAMUSCULAR | Status: DC | PRN
Start: 2023-09-27 — End: 2023-09-28
  Administered 2023-09-28 (×3): 0 mg via INTRAVENOUS

## 2023-09-27 MED ORDER — ELECTROLYTE-A IV BOLUS
Freq: Once | INTRAVENOUS | Status: DC
Start: 2023-09-27 — End: 2023-09-27

## 2023-09-27 MED FILL — LAMOTRIGINE 200 MG TABLET: 200 mg | ORAL | Qty: 1

## 2023-09-27 MED FILL — METRONIDAZOLE 500 MG/100 ML IN SODIUM CHLOR(ISO) INTRAVENOUS PIGGYBACK: 500 500 mg/100 mL | INTRAVENOUS | Qty: 100

## 2023-09-27 MED FILL — LIDOCAINE 4 % TOPICAL PATCH: 4 % | TOPICAL | Qty: 1

## 2023-09-27 MED FILL — DOXEPIN 25 MG CAPSULE: 25 mg | ORAL | Qty: 1

## 2023-09-27 MED FILL — VENLAFAXINE ER 150 MG CAPSULE,EXTENDED RELEASE 24 HR: 150 mg | ORAL | Qty: 1

## 2023-09-27 MED FILL — CEFTRIAXONE 1 GRAM SOLUTION FOR INJECTION: 1 1 gram | INTRAMUSCULAR | Qty: 1000

## 2023-09-27 MED FILL — IBUPROFEN 600 MG TABLET: 600 mg | ORAL | Qty: 1

## 2023-09-27 MED FILL — ACETAMINOPHEN 500 MG TABLET: 500 500 mg | ORAL | Qty: 2

## 2023-09-27 MED FILL — PROPRANOLOL ER 60 MG CAPSULE,24 HR,EXTENDED RELEASE: 60 mg | ORAL | Qty: 1

## 2023-09-27 MED FILL — LORAZEPAM 1 MG TABLET: 1 1 mg | ORAL | Qty: 1

## 2023-09-27 NOTE — H&P (Signed)
SURGERY H&P NOTE     Problem-based Assessment & Plan  Todd Bates is a 33 y.o. adult hx bipolar dx, depression, anxiety presents w 1d hx abd pain and vomiting, referred to [facility] from [facility] w cf acute appendicitis. Discussed non-operative vs operative options with patient, including risks of surgery that include bleeding, pain, infection, damage to surrounding structures, worsening clinical condition, need for drain placement. Patient expressed understanding and wished to proceed with surgery.     Plan:  Admit ACS  CTX/flagyl  NPO  IVF  Add on for lap appy vs open    History of Present Illness  32yo NB adult presents after 1d of diarrhea and emesis w worsening abd pain in RLQ. Pain worse with movement or changes in position. No nausea or vomiting this morning. Last ate saltine crackers at 0300 and ginger ale at 0700. No pain with urination.     [facility] ED.     Had tonsils removed a few years ago     Vitals stable, afebrile     Past Medical History:   Diagnosis Date    Allergy     Anemia     Anxiety 06/2011    Asthma 12/2011    Hx of exercise induced asthma in college    Depression 06/2011    Headache     Mental disorder 06/2011    Obesity 08/19/2021    Sleep disorder 06/2011     Past Surgical History:   Procedure Laterality Date    TONSILLECTOMY  09/2017       Immunization History   Administered Date(s) Administered    HPV 9 GARDASIL 02/29/2016    Influenza 06/04/2020, 05/08/2021    Influenza-CC 05/15/2016    Meningococcal ACWY 05/03/2021    Meningococcal Group A/C/W/Y conjugate 05/03/2021    Monkeypox (Historical) 05/03/2021    PPD Test 02/17/2021    Pfizer Bivalent Covid (12+) 07/20/2021    Pfizer Covid (12+ YO) 07/05/2022, 05/12/2023    Pfizer Covid Vaccine Wallace Cullens Top) 03/05/2021    Pfizer Sars-Cov-2 Vaccine (Purple Top) 06/04/2020    Tdap 05/16/2014       Allergies: Fig and Quince seed     (Not in a hospital  admission)      Social History     Socioeconomic History    Marital status: Single   Tobacco Use    Smoking status: Some Days     Current packs/day: 0.00     Average packs/day: 0.3 packs/day for 2.2 years (0.5 ttl pk-yrs)     Types: Cigarettes, Vape     Start date: 06/30/2017     Last attempt to quit: 09/09/2019     Years since quitting: 4.0    Smokeless tobacco: Never   Substance and Sexual Activity    Alcohol use: Yes     Alcohol/week: 1.0 standard drink of alcohol     Types: 1 Glasses of wine per week     Comment: occasionally    Drug use: Not Currently     Types: Amyl nitrate    Sexual activity: Yes     Partners: Male   Social History Narrative    Single, RN [employer]       Family History   Problem Relation Name Age of Onset    Anxiety disorder Mother Elnita Maxwell     Depression Mother Elnita Maxwell     Kidney cancer Mother Elnita Maxwell  Kidney removed due to cancer in early 20s    Bipolar disorder Mother Elnita Maxwell     Mental illness Father Jomarie Longs         Most likely undiagnosed    Anxiety disorder Sister Cala Bradford     Depression Sister Cala Bradford     Bipolar disorder Sister Cala Bradford     Diabetes Maternal Grandmother Ollen Gross     Thyroid disease Maternal Grandmother Marjorie     Alcohol abuse Maternal Grandfather Fayrene Fearing     Bleeding disorder Maternal Grandfather Fayrene Fearing     Lung cancer Maternal Grandfather Fayrene Fearing     Dementia Maternal Grandfather James     High blood pressure Maternal Cynda Familia     Mental illness Maternal Cynda Familia         Most likely undiagnosed    Stroke Maternal Cynda Familia     Coronary Artery Disease Paternal Grandmother          Had a bypass    Colon cancer Paternal Grandfather Clyde     Thyroid cancer Paternal Theador Hawthorne     Coronary Artery Disease Paternal Theador Hawthorne         Had a bypass       Vitals  Temp:  [36.6 C (97.9 F)-36.7 C (98 F)] 36.6 C (97.9 F)  Heart Rate:  [85-118] 85  *Resp:  [16] 16  BP: (110-123)/(77-82) 110/77  SpO2:  [98 %-99 %] 99 %    Input / Output  No  intake/output data recorded.    Physical Exam  Gen: NAD  Pulm: NWOB  CV: RRR  Abd:  Soft, NTND. No rebound or guarding  +RLQ TTP    Data    CBC        09/27/23  0914   WBC 6.1   HGB 13.7   HCT 39.3*   PLT 280       Coags  No results found in last 72 hours    Chem7        09/27/23  0914   NA 138   K 4.1   CL 100*   CO2 27   BUN 9   CREAT 1.06   GLU 101       Liver Panel        09/27/23  0914   AST 13   ALT 12   TBILI 0.8   ALB 4.3         Radiology Results  Per my independent review, thickening of appendiceal wall, appendix measuring ~1.1cm with surrounding fat stranding and edema    Code Status: Full     Orson Eva, MD  09/27/2023

## 2023-09-27 NOTE — Patient Instructions (Addendum)
We discussed your abdominal pain:  - I ordered a CT scan of your abdomen to rule out appendicitis or other issues. Radiology will perform the scan later today.  - I ordered blood tests (CBC, CMP, lipase) and a urine test to help determine the cause of your symptoms.  - Please refrain from eating or drinking until we get the results of the scan.  - You can go home and keep your phone on you; I will call you with the results. If anything requires immediate attention, you may need to go to [place].     If you had bloodwork or radiology ordered, your results will be visible online using MyChart (set up an account here: [email or web address]). We do our best to contact every patient with clinically significant abnormal results, so make sure to confirm your phone number with our front desk staff on checkout if you are waiting on results, in case they come back abnormal.     If you have questions about your results, book a follow-up appointment (we offer video or in-person follow-ups).     If your condition does not start to improve over the next week, or if you get worse, please schedule a follow up visit to see [place] at [facility].     To book a follow up appointment:  You can call our front desk at [phone] to schedule, or you can book appointments online using MyChart or our website: [email or web address]      **Note: [facility] does NOT have emergency services, so if you think you're experiencing a life-threatening or severe condition, call 911 or go directly to the nearest emergency department.    When to go to the ER versus Urgent Care:   [email or web address]

## 2023-09-27 NOTE — ED Provider Notes (Signed)
EMERGENCY DEPARTMENT PHYSICIAN NOTE      ED Attending(s): Todd Bates 09/27/23 11:25    Chief Complaint   Patient presents with    Abdominal Pain     Patient reports abdominal pain x Saturday morning.        HISTORY     Interpreter used? (Optional): No    Todd Bates is a 32 y.o.adult, with PMH axiety, depression and bipolar presenting from [facility] care with CT evidence of appendicitis.  Pt states they developed n/v/d 2 days ago  About one day ago developed diffuse abdominal pain, then more localized this AM to Right side.  V/D have abated  +decreased appetite  No fevers      Review of outside labs/imaging done today without leukocytosis, liver or electrolyte abnomrality  CT abd/pelvis w contrast prelim report:  WET READ:   1.  Uncomplicated appendicitis. An area of relative hypoenhancement within the proximal appendix is concerning for focal ischemia and may portend impending rupture. No current evidence of perforation or abscess.   2.  1.3 cm left renal mass may represent a proteinaceous or hemorrhagic cyst however is indeterminant. Recommend further evaluation with nonemergent renal ultrasound to confirm cystic consistency.    Allergies/Contraindications   Allergen Reactions    Fig Hives, Itching and Rash    Quince Seed Hives, Itching and Rash         Medical History    Past Medical History   Diagnosis Date    Allergy     Anemia     Anxiety 06/2011    Asthma 12/2011    Hx of exercise induced asthma in college    Depression 06/2011    Headache     Mental disorder 06/2011    Obesity 08/19/2021    Sleep disorder 06/2011              Surgical History    Past Surgical History:   Procedure Laterality Date    TONSILLECTOMY  09/2017              Family History    Family History   Problem Relation Name Age of Onset    Anxiety disorder Mother Todd Bates     Depression Mother Todd Bates     Kidney cancer Mother Todd Bates         Kidney removed due to cancer in early 20s    Bipolar disorder Mother Todd Bates     Mental  illness Father Todd Bates         Most likely undiagnosed    Anxiety disorder Sister Todd Bates     Depression Sister Todd Bates     Bipolar disorder Sister Todd Bates     Diabetes Maternal Grandmother Todd Bates     Thyroid disease Maternal Grandmother Todd Bates     Alcohol abuse Maternal Grandfather Todd Bates     Bleeding disorder Maternal Todd Bates     Lung cancer Maternal Grandfather Todd Bates     Dementia Maternal Grandfather Todd Bates     High blood pressure Maternal Todd Bates     Mental illness Maternal Todd Bates         Most likely undiagnosed    Stroke Maternal Todd Bates     Coronary Artery Disease Paternal Grandmother          Had a bypass    Colon cancer Paternal Grandfather Todd Bates     Thyroid cancer Paternal Todd Bates     Coronary Artery Disease Paternal Todd Bates  Had a bypass             Social History     Socioeconomic History    Marital status: Single   Tobacco Use    Smoking status: Some Days     Current packs/day: 0.00     Average packs/day: 0.3 packs/day for 2.2 years (0.5 ttl pk-yrs)     Types: Cigarettes, Vape     Start date: 06/30/2017     Last attempt to quit: 09/09/2019     Years since quitting: 4.0    Smokeless tobacco: Never   Substance and Sexual Activity    Alcohol use: Yes     Alcohol/week: 1.0 standard drink of alcohol     Types: 1 Glasses of wine per week     Comment: occasionally    Drug use: Not Currently     Types: Amyl nitrate    Sexual activity: Yes     Partners: Male   Social History Narrative    Single, 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   Constitutional:       Appearance: Normal appearance. Todd Bates is not ill-appearing or toxic-appearing.   HENT:      Head: Normocephalic and atraumatic.      Right Ear: External ear  normal.      Left Ear: External ear normal.   Eyes:      Conjunctiva/sclera: Conjunctivae normal.   Cardiovascular:      Rate and Rhythm: Normal rate and regular rhythm.   Pulmonary:      Effort: Pulmonary effort is normal.      Breath sounds: Normal breath sounds.   Abdominal:      Tenderness: There is abdominal tenderness in the right lower quadrant and suprapubic area. There is guarding. There is no right CVA tenderness or left CVA tenderness. Positive signs include McBurney's sign.   Musculoskeletal:         General: No deformity.      Cervical back: Normal range of motion.   Skin:     General: Skin is warm and dry.   Neurological:      General: No focal deficit present.      Mental Status: Todd Bates is alert.   Psychiatric:         Behavior: Behavior normal.         Medical Decision Making           Medical Decision Making  Pt with history and physical c/w appendicitis and seen at outside urgent care with CT imaging confirming diagnosis  Reports ew sips of gingerale around 0700 and ate a feew saltines around 0300 today, nothing since.  No prior abd surgeries  Denies urinary complaints  Plan for gen surgery consult/admit    Amount and/or Complexity of Data Reviewed  External Data Reviewed: labs and radiology.     Details: See HPI  Discussion of management or test interpretation with external provider(s): General surgery    Risk  Prescription drug management.  Decision regarding hospitalization.                Final Disposition  and ED Course     ED DISPOSITION: Admit General surgery           ED Course as of 09/28/23 1304   Todd Bates's Documentation   Wynelle Link Sep 27, 2023   1123 Gen surg paged and aware   1210 Gen surgery to admit primarily         Clinical Impressions as of 09/28/23 1304   Acute appendicitis, unspecified acute appendicitis type   Anxiety     APP Visit Information:   APP Service Type:  Independent  Available MD consultant:  Todd Mains, MD             Todd Bates,  [place]  09/27/23 1213       Todd Mains, MD  09/27/23 2146

## 2023-09-27 NOTE — Progress Notes (Addendum)
URGENT CARE PHYSICIAN NOTE   Todd Bates is a 33 y.o. old adult, with History of Present Illness    Abdominal Pain:  - Onset: Began over 24 hours ago, following episodes of diarrhea and emesis.  - Location: Primarily in the RLQ.  - Severity: Baseline pain rated at 4/10, increasing to 7/10 with movement or repositioning.  - Associated Symptoms: Bloating; denies current nausea.  - Last bowel movement and emesis occurred around 0200 on Saturday morning.  - Dietary Intake: Limited to saltines, ginger ale, and small amounts of water since onset.  - Denies use of antidiarrheal medications.  - Denies dysuria.  - No history of abdominal surgery.    Anxiety and Depression:  - Managed with Lamictal, propranolol, Effexor, Ativan (PRN), and doxepin (PRN).    Asthma:  - Managed with albuterol inhaler (PRN).    HIV Prophylaxis:  - Receiving Apretude injections.         I obtained consent from the Patient or Surrogate Decision Maker, as well as from all individuals accompanying the patient, to record and utilize a transcription to assist with the creation of documentation of the visit. I also obtained consent from any others recorded during the encounter.    Allergies/Contraindications   Allergen Reactions    Fig Hives, Itching and Rash    Quince Seed Hives, Itching and Rash       Past Medical History:   Diagnosis Date    Allergy     Anemia     Anxiety 06/2011    Asthma 12/2011    Hx of exercise induced asthma in college    Depression 06/2011    Headache     Mental disorder 06/2011    Obesity 08/19/2021    Sleep disorder 06/2011      Past Surgical History:   Procedure Laterality Date    TONSILLECTOMY  09/2017     Family History   Problem Relation Name Age of Onset    Anxiety disorder Mother Todd Bates     Depression Mother Todd Bates     Kidney cancer Mother Todd Bates         Kidney removed due to cancer in early 20s    Bipolar disorder Mother Todd Bates     Mental illness Father Todd Bates         Most likely undiagnosed    Anxiety disorder  Sister Todd Bates     Depression Sister Todd Bates     Bipolar disorder Sister Todd Bates     Diabetes Maternal Grandmother Todd Bates     Thyroid disease Maternal Grandmother Todd Bates     Alcohol abuse Maternal Grandfather Todd Bates     Bleeding disorder Maternal Grandfather Todd Bates     Lung cancer Maternal Grandfather Todd Bates     Dementia Maternal Grandfather Todd Bates     High blood pressure Maternal Todd Bates     Mental illness Maternal Todd Bates         Most likely undiagnosed    Stroke Maternal Todd Bates     Coronary Artery Disease Paternal Grandmother          Had a bypass    Colon cancer Paternal Grandfather Todd Bates     Thyroid cancer Paternal Todd Bates     Coronary Artery Disease Paternal Todd Bates         Had a bypass       Social History     Socioeconomic History    Marital status: Single   Tobacco Use    Smoking status:  Some Days     Current packs/day: 0.00     Average packs/day: 0.3 packs/day for 2.2 years (0.5 ttl pk-yrs)     Types: Cigarettes, Vape     Start date: 06/30/2017     Last attempt to quit: 09/09/2019     Years since quitting: 4.0    Smokeless tobacco: Never   Substance and Sexual Activity    Alcohol use: Yes     Alcohol/week: 1.0 standard drink of alcohol     Types: 1 Glasses of wine per week     Comment: occasionally    Drug use: Not Currently     Types: Amyl nitrate    Sexual activity: Yes     Partners: Male   Social History Narrative    Single, 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   Constitutional:       Appearance: Normal appearance.   HENT:      Head: Normocephalic and atraumatic.      Right Ear: External ear normal.      Left Ear: External ear normal.   Eyes:      Conjunctiva/sclera: Conjunctivae normal.   Cardiovascular:      Rate and Rhythm: Normal rate.   Pulmonary:       Effort: Pulmonary effort is normal. No respiratory distress.   Abdominal:      General: There is no distension.      Palpations: Abdomen is soft.      Tenderness: There is abdominal tenderness. There is no guarding or rebound.      Comments: +ttp in RLQ    Skin:     Coloration: Skin is not jaundiced or pale.   Neurological:      Mental Status: Phuc is alert and oriented to person, place, and time.   Psychiatric:         Mood and Affect: Mood normal.         MEDICAL DECISION MAKING     Differential Diagnosis: # Right lower quadrant abdominal pain (R10.31)  Acute onset of right lower quadrant abdominal pain, beginning approximately 24 hours ago, with associated nausea and prior episodes of diarrhea and emesis. Pain intensity varies from 4/10 at rest to 7/10 with movement. No history of abdominal surgery. Physical examination reveals significant tenderness at McBurney's point. Differential diagnoses include appendicitis and viral gastroenteritis.  - Ordered CBC, CMP, lipase, and urinalysis.  - Ordered CT scan of the abdomen to evaluate for appendicitis.  - Advised patient to remain NPO until results of the CT scan are available.  - Patient to keep cell phone on hand for communication regarding test results.      1.  Uncomplicated appendicitis. An area of relative hypoenhancement within the proximal appendix is concerning for focal ischemia and may portend rupture. No current evidence of perforation.     2.  1.3 cm left renal mass may represent a proteinaceous or hemorrhagic cyst however is indeterminant. Recommend further evaluation with nonemergent renal protocol CT or MRI.    Called and spoke with pt, remaining NPO, will self present to [facility] ED, aware of renal lesion  ED Referral called in       [facility] ED REFERRALS     Please call the ED Clerk at [phone] as this is necessary to initiate the ED Expected encounter for your patient.    If you are sending the patient for admission, does the patient have an  emergent issue preventing direct admission? If not please call Bed Control for a direct admission [phone].      Reason for sending the patient to the ED today - any brief relevant past history and the acute cause or need: acute appy     Specific tests or actions you are requesting be considered today in the ED: surgery eval    Method of Transport: self

## 2023-09-28 LAB — COMPLETE BLOOD COUNT
Hematocrit: 33.5 % — ABNORMAL LOW (ref 41.0–53.0)
Hemoglobin: 11.6 g/dL — ABNORMAL LOW (ref 13.6–17.5)
MCH: 32.3 pg (ref 26.0–34.0)
MCHC: 34.6 g/dL (ref 31.0–36.0)
MCV: 93 fL (ref 80–100)
MPV: 9.9 fL (ref 9.1–12.6)
Platelet Count: 204 10*9/L (ref 140–450)
RBC Count: 3.59 10*12/L — ABNORMAL LOW (ref 4.40–5.90)
RDW-CV: 11.9 % (ref 11.7–14.4)
WBC Count: 5.4 10*9/L (ref 3.4–10.0)

## 2023-09-28 LAB — BASIC METABOLIC PANEL (NA, K, CL, CO2, BUN, CR, GLU, CA)
Anion Gap: 7 (ref 4–14)
Calcium, total, Serum / Plasma: 8.7 mg/dL (ref 8.4–10.5)
Carbon Dioxide, Total: 28 mmol/L (ref 22–29)
Chloride, Serum / Plasma: 104 mmol/L (ref 101–110)
Creatinine: 1 mg/dL (ref 0.73–1.24)
Glucose, non-fasting: 79 mg/dL (ref 70–199)
Potassium, Serum / Plasma: 4 mmol/L (ref 3.5–5.0)
Sodium, Serum / Plasma: 139 mmol/L (ref 135–145)
Urea Nitrogen, serum/plasma: 10 mg/dL (ref 7–25)
eGFRcr: 104 mL/min/{1.73_m2} (ref >59)

## 2023-09-28 MED ORDER — SUGAMMADEX 100 MG/ML INTRAVENOUS SOLUTION
100 | Freq: Once | INTRAVENOUS | Status: DC | PRN
Start: 2023-09-28 — End: 2023-09-28
  Administered 2023-09-28: 20:00:00 100 via INTRAVENOUS
  Administered 2023-09-28: 20:00:00 200 via INTRAVENOUS

## 2023-09-28 MED ORDER — ONDANSETRON HCL (PF) 4 MG/2 ML INJECTION SOLUTION
4 | Freq: Once | INTRAMUSCULAR | Status: DC | PRN
Start: 2023-09-28 — End: 2023-09-28
  Administered 2023-09-28: 20:00:00 4 via INTRAVENOUS

## 2023-09-28 MED ORDER — SENNOSIDES 8.6 MG TABLET
8.6 mg | ORAL_TABLET | Freq: Every evening | ORAL | 0 refills | Status: DC | PRN
Start: 2023-09-28 — End: 2023-11-09

## 2023-09-28 MED ORDER — PHENYLEPHRINE 10 MG/ML INJECTION SOLUTION
10 | INTRAMUSCULAR | Status: DC | PRN
Start: 2023-09-28 — End: 2023-09-28
  Administered 2023-09-28 (×2): 100 via INTRAVENOUS
  Administered 2023-09-28: 19:00:00 200 via INTRAVENOUS
  Administered 2023-09-28: 20:00:00 100 via INTRAVENOUS
  Administered 2023-09-28: 19:00:00 25 via INTRAVENOUS
  Administered 2023-09-28: 19:00:00 40 via INTRAVENOUS

## 2023-09-28 MED ORDER — PROPOFOL 10 MG/ML INTRAVENOUS EMULSION FOR OR
10 | Freq: Once | INTRAVENOUS | Status: DC | PRN
Start: 2023-09-28 — End: 2023-09-28
  Administered 2023-09-28 (×2): 25 via INTRAVENOUS
  Administered 2023-09-28: 19:00:00 50 via INTRAVENOUS
  Administered 2023-09-28: 19:00:00 200 via INTRAVENOUS

## 2023-09-28 MED ORDER — DEXAMETHASONE SODIUM PHOSPHATE 4 MG/ML INJECTION SOLUTION
4 | Freq: Once | INTRAMUSCULAR | Status: DC | PRN
Start: 2023-09-28 — End: 2023-09-28
  Administered 2023-09-28: 19:00:00 8 via INTRAVENOUS

## 2023-09-28 MED ORDER — BUPIVACAINE (PF) 0.25 % (2.5 MG/ML) INJECTION SOLUTION
2.5 | INTRAMUSCULAR | Status: DC | PRN
Start: 2023-09-28 — End: 2023-09-28
  Administered 2023-09-28: 20:00:00 12 via INTRAMUSCULAR

## 2023-09-28 MED ORDER — POLYETHYLENE GLYCOL 3350 17 GRAM ORAL POWDER PACKET
17 gram | PACK | Freq: Every day | ORAL | 0 refills | Status: DC | PRN
Start: 2023-09-28 — End: 2023-11-09

## 2023-09-28 MED ORDER — HYDROMORPHONE (PF) 0.5 MG/0.5 ML INJECTION SYRINGE
0.5 | INTRAMUSCULAR | Status: DC | PRN
Start: 2023-09-28 — End: 2023-09-28

## 2023-09-28 MED ORDER — MIDAZOLAM 1 MG/ML INJECTION SOLUTION
1 | Freq: Once | INTRAMUSCULAR | Status: DC | PRN
Start: 2023-09-28 — End: 2023-09-28
  Administered 2023-09-28: 19:00:00 2 via INTRAVENOUS

## 2023-09-28 MED ORDER — FENTANYL (PF) 50 MCG/ML INJECTION SOLUTION
50 | INTRAMUSCULAR | Status: DC | PRN
Start: 2023-09-28 — End: 2023-09-28

## 2023-09-28 MED ORDER — SUCCINYLCHOLINE CHLORIDE 200 MG/10 ML (20 MG/ML) INTRAVENOUS SYRINGE
200 | Freq: Once | INTRAVENOUS | Status: DC | PRN
Start: 2023-09-28 — End: 2023-09-28
  Administered 2023-09-28: 19:00:00 100 via INTRAVENOUS

## 2023-09-28 MED ORDER — OXYCODONE 5 MG TABLET
5 | Freq: Once | ORAL | Status: AC | PRN
Start: 2023-09-28 — End: 2023-09-28
  Administered 2023-09-28: 23:00:00 5 mg via ORAL

## 2023-09-28 MED ORDER — DOXEPIN 25 MG CAPSULE
25 | ORAL_CAPSULE | Freq: Every day | ORAL | 0 refills | Status: AC
Start: 2023-09-28 — End: ?

## 2023-09-28 MED ORDER — OXYCODONE 5 MG TABLET
5 mg | ORAL_TABLET | Freq: Three times a day (TID) | ORAL | 0 refills | Status: DC | PRN
Start: 2023-09-28 — End: 2023-11-09

## 2023-09-28 MED ORDER — ACETAMINOPHEN 500 MG GELCAP
500 | Freq: Once | ORAL | Status: DC | PRN
Start: 2023-09-28 — End: 2023-09-28

## 2023-09-28 MED ORDER — HALOPERIDOL LACTATE 5 MG/ML INJECTION SOLUTION
5 | Freq: Four times a day (QID) | INTRAMUSCULAR | Status: DC | PRN
Start: 2023-09-28 — End: 2023-09-28

## 2023-09-28 MED ORDER — FENTANYL (PF) 50 MCG/ML INJECTION SOLUTION
50 | INTRAMUSCULAR | Status: AC
Start: 2023-09-28 — End: ?

## 2023-09-28 MED ORDER — ACETAMINOPHEN 500 MG TABLET
500 | ORAL_TABLET | Freq: Three times a day (TID) | ORAL | 0 refills | Status: AC
Start: 2023-09-28 — End: 2023-10-12

## 2023-09-28 MED ORDER — MENTHOL LOZENGES (~~LOC~~ WRAPPER)
Status: DC | PRN
Start: 2023-09-28 — End: 2023-09-28

## 2023-09-28 MED ORDER — FENTANYL (PF) 50 MCG/ML INJECTION SOLUTION
50 | Freq: Once | INTRAMUSCULAR | Status: DC | PRN
Start: 2023-09-28 — End: 2023-09-28
  Administered 2023-09-28: 19:00:00 100 via INTRAVENOUS

## 2023-09-28 MED ORDER — ROCURONIUM 10 MG/ML INTRAVENOUS SOLUTION
10 | Freq: Once | INTRAVENOUS | Status: DC | PRN
Start: 2023-09-28 — End: 2023-09-28
  Administered 2023-09-28: 19:00:00 50 via INTRAVENOUS
  Administered 2023-09-28: 20:00:00 20 via INTRAVENOUS

## 2023-09-28 MED ORDER — IBUPROFEN 600 MG TABLET
600 | ORAL_TABLET | Freq: Three times a day (TID) | ORAL | 0 refills | Status: AC
Start: 2023-09-28 — End: 2023-10-08

## 2023-09-28 MED ORDER — ONDANSETRON HCL (PF) 4 MG/2 ML INJECTION SOLUTION
4 | Freq: Four times a day (QID) | INTRAMUSCULAR | Status: DC | PRN
Start: 2023-09-28 — End: 2023-09-28

## 2023-09-28 MED ORDER — PROPRANOLOL ER 60 MG CAPSULE,24 HR,EXTENDED RELEASE
60 | ORAL_CAPSULE | Freq: Every day | ORAL | 1 refills | Status: DC
Start: 2023-09-28 — End: 2024-05-10

## 2023-09-28 MED ORDER — FLU VACCINE TS 2024-25(6MOS UP)(PF) 45 MCG(15MCG X3)/0.5 ML IM SYRINGE
45 | Freq: Once | INTRAMUSCULAR | Status: DC
Start: 2023-09-28 — End: 2023-09-28

## 2023-09-28 MED ORDER — KETOROLAC 30 MG/ML (1 ML) INJECTION SOLUTION
30 | Freq: Once | INTRAMUSCULAR | Status: DC | PRN
Start: 2023-09-28 — End: 2023-09-28
  Administered 2023-09-28: 20:00:00 15 via INTRAVENOUS

## 2023-09-28 MED ORDER — MIDAZOLAM 1 MG/ML INJECTION SOLUTION
1 | INTRAMUSCULAR | Status: AC
Start: 2023-09-28 — End: ?

## 2023-09-28 MED ORDER — CEFAZOLIN 1 GRAM SOLUTION FOR INJECTION
1 | Freq: Once | INTRAMUSCULAR | Status: DC | PRN
Start: 2023-09-28 — End: 2023-09-28
  Administered 2023-09-28: 19:00:00 2000 via INTRAVENOUS

## 2023-09-28 MED ORDER — LACTATED RINGERS INTRAVENOUS SOLUTION
INTRAVENOUS | Status: DC | PRN
Start: 2023-09-28 — End: 2023-09-28

## 2023-09-28 MED ORDER — LIDOCAINE (PF) 20 MG/ML (2 %) INJECTION SOLUTION
20 | Freq: Once | INTRAMUSCULAR | Status: DC | PRN
Start: 2023-09-28 — End: 2023-09-28
  Administered 2023-09-28: 19:00:00 100 via INTRAVENOUS

## 2023-09-28 MED FILL — OXYCODONE 5 MG TABLET: 5 5 mg | ORAL | Qty: 1

## 2023-09-28 MED FILL — LORAZEPAM 1 MG TABLET: 1 1 mg | ORAL | Qty: 1

## 2023-09-28 MED FILL — ACETAMINOPHEN 500 MG TABLET: 500 500 mg | ORAL | Qty: 2

## 2023-09-28 MED FILL — DILAUDID (PF) 0.5 MG/0.5 ML INJECTION SYRINGE: 0.5 mg/ mL | INTRAMUSCULAR | Qty: 0.5

## 2023-09-28 MED FILL — DILAUDID (PF) 0.5 MG/0.5 ML INJECTION SYRINGE: 0.5 0.5 mg/0.5 mL | INTRAMUSCULAR | Qty: 0.5

## 2023-09-28 MED FILL — VENTOLIN HFA 90 MCG/ACTUATION AEROSOL INHALER: 90 90 mcg/actuation | RESPIRATORY_TRACT | Qty: 8

## 2023-09-28 MED FILL — ONDANSETRON HCL 4 MG TABLET: 4 mg | ORAL | Qty: 1

## 2023-09-28 MED FILL — DOXEPIN 25 MG CAPSULE: 25 25 mg | ORAL | Qty: 1

## 2023-09-28 MED FILL — FENTANYL (PF) 50 MCG/ML INJECTION SOLUTION: 50 50 mcg/mL | INTRAMUSCULAR | Qty: 5

## 2023-09-28 MED FILL — VENLAFAXINE ER 150 MG CAPSULE,EXTENDED RELEASE 24 HR: 150 150 mg | ORAL | Qty: 1

## 2023-09-28 MED FILL — IBUPROFEN 600 MG TABLET: 600 mg | ORAL | Qty: 1

## 2023-09-28 MED FILL — OXYCODONE 5 MG TABLET: 5 5 mg | ORAL | Qty: 2

## 2023-09-28 MED FILL — MIDAZOLAM 1 MG/ML INJECTION SOLUTION: 1 1 mg/mL | INTRAMUSCULAR | Qty: 2

## 2023-09-28 MED FILL — PROPRANOLOL ER 60 MG CAPSULE,24 HR,EXTENDED RELEASE: 60 60 mg | ORAL | Qty: 1

## 2023-09-28 MED FILL — LAMOTRIGINE 200 MG TABLET: 200 200 mg | ORAL | Qty: 1

## 2023-09-28 NOTE — Interdisciplinary (Signed)
CASE MANAGEMENT INITIAL ASSESSMENT     Admitting Diagnosis: Appendicitis [K37]  Acute appendicitis, unspecified acute appendicitis type [K35.80]    Todd Bates is a 33 y.o. adult hx bipolar dx, depression, anxiety presents w 1d hx abd pain and vomiting, referred to [place] from [facility] urgent care w cf acute appendicitis. CT confirmed uncomplicated appendicitis.     Payor: [employer] / Plan: [facility] C[facility]/[employer] BLUE CROSS [place] / Product Type: PPO /      CM Adult Assessment - 09/28/23 1237          Screening    Any anticipated barriers to discharge? No     Does patient have any discharge planning needs? No     Expected Discharge Disposition Home or Self Care        Adult Assessment    Does the Patient have a PCP? Yes     PCP name and contact information Todd Fleet, Todd Bates Nurse Practitioner Family Medicine   Address:  [address]  [place] [address] Phone: [phone]  Fax: [phone]                   NURSING AND PHYSICAL THERAPY INFORMATION    AM-PAC Mobility 6 Clicks Score:     Physical Therapy Discharge Recommendation:     Rehab Assistive Device Recommendation:     Discharge transportation requirements:       CONTINUED CARE and SERVICES COORDINATION  N/A    NOTES  Patient admitted for appendicitis after presenting to ER with RLQ pain. No CM needs at this time, CM will follow should DC needs develop.    Lovett Sox, RN CM   Per Diem Case Manager Covering today        LIZ CONG  09/28/2023

## 2023-09-28 NOTE — Discharge Instructions (Addendum)
Instructions for Post-Surgical Care at Home    The name of your surgery is laparoscopic or robotic appendectomy, which means that your appendix was removed using a minimally invasive technique. This was done for appendicitis, which means that your appendix was infected.    People live a normal life without an appendix. You do not have to change your diet or your activity as a result of having it removed.    The name of your surgeon is Dr. Annye Rusk     If you need urgent help, call [phone].  This number is always answered. However, if you need non-urgent assistance or have questions related to your surgery or condition, please send your surgeon a [facility] MyChart message.    Contact your surgeon if you develop any of the following:   abdominal pain that becomes much worse than when you left the hospital and does not improve with pain medication  the area around your incisions becomes very red, increasingly tender, or begins to drain pus (small amounts of blood-tinged fluid and mild redness are common and are no cause for concern)  your temperature goes above 100.4 F (38 C)  persistent vomiting or diarrhea  you feel as if you are getting more sick     Diet   You may eat any kind of food that you like. There are no foods that will affect your recovery. You require no changes in your diet because of this operation.       Pain Control and Other Medications   It is normal to have some pain after surgery. Your pain should be well enough controlled that you can walk around. Pain medication will help manage your pain, but it will not get rid of it completely. Your doctor may have given you a prescription for pain medicine to take as needed. For all other medications prescribed by your surgeon, take as instructed. Unless your surgeon states otherwise, you may resume any medications you were taking prior to surgery.      Pain medications: You may stop this regimen when having less pain. We recommend you stop in the  following approach if you are already taking all of these in the hospital. Follow your doctors instructions if you have been directed to take these medications differently.   Opioids (e.g., oxycodone) - if you have pain that limits your activity and that is not controlled on the medications above, you may take an opioid as prescribed. Do not drive while taking opioid medications. This is the first pain medication that you should stop when your pain starts improving.  Ibuprofen - 600 mg every 6 hours scheduled for the next two weeks, ONLY if prescribed by your doctor. Take with food. Stop this second when your pain starts improving. Do not take if you have kidney problems or history of gastrointestinal bleeding.   Tylenol - 1000 mg every 6 hours on a scheduled basis for baseline pain control. Stop this last.  Please bring unused narcotics to the follow up appointment for disposal or give them to your local pharmacy for proper disposal.    Bowel Movements   Your bowel movements may be irregular or loose for several weeks, but they will gradually return to normal. Opioid pain medications may cause constipation. As the need for your pain medicine decreases, your constipation will improve. If you go for more than a couple of days without having a bowel movement, try eating prunes or taking a gentle laxative, such as milk  of magnesia. Your doctor may have prescribed you a bowel regimen including stool softeners, please take as prescribed.      Incisions   Your incisions are covered with medical glue or surgical stickers, which will fall off after 10-14 days. Do not pull off the glue or stickers on your own. There are no stitches in the incisions that must be removed. If it is more comfortable, you may cover the incisions with Band-Aids for a week, but this is not necessary for healing.      Bathing   You may take showers 2 days after your surgery. Allow soap and water to wash over incisions, but do not scrub your  incisions. avoid taking baths until your incisions have fully healed, usually at 3-4 weeks after surgery.         Activity   If you were given an abdominal binder, you should wear your binder for 6 weeks after surgery or as instructed by your doctor.   It is common to feel a little more tired than expected for the first few days after surgery. This comes from the increased activity involved in moving around at home. You should not limit your activity, but make sure to get enough rest.   You may undertake normal daily activities as tolerated. This includes walking up and down stairs, walking outside the house, driving a car, sexual intercourse, etc.  You may return to work or full activity when you feel well enough, which usually occurs within a week. Check with your doctor if you have specific questions  Refrain from heavy lifting (>10) for at least two weeks after surgery. Begin to return to more vigorous activities, such as sports, within a week of the operation if you are feeling well enough.     Follow-up Contacts   If you have questions related to your surgery or condition in the next few weeks, please send your surgeon a [facility] MyChart message.  This is the preferred method of communication. You can send messages to your surgeon, view your clinical lab results, request medication refills, request appointments, and more. If you need to sign up, go to [email or web address] and click Request an Account.  An activation code will be sent to you within 2 business days.    Our clinic scheduler has likely already scheduled your follow-up appointment.  Please call [phone] if you do not see this appointment in your MyChart account within 2 days or need to reschedule.    Primary Care follow up: It is also important to see your primary care doctor after surgery. We recommend you schedule a visit within one week of discharge to have them review your hospital stay and follow you in the post operative period if needed.

## 2023-09-28 NOTE — Anesthesia Pre-Procedure Evaluation (Addendum)
[facility]  Anesthesia Preprocedure Evaluation    Procedure Information       Case: [id number] Date/Time: 09/28/23 0935    Procedures:       LAPAROSCOPIC APPENDECTOMY (Abdomen) - available anytime, [phone]      OPEN APPENDECTOMY (Abdomen)      ACUTE CARE SERVICE SURGERY (SECONDARY)    Diagnosis: Acute appendicitis, unspecified acute appendicitis type [K35.80]    Pre-op diagnosis: Acute appendicitis, unspecified acute appendicitis type [K35.80]    Location: [facility] /  [facility]    Surgeons: Annye Rusk, MD            Precautions            None            Relevant Problems   No relevant active problems         Anesthesia Encounter History        CC/HPI/Past Medical History Summary: 33 yo with PMH anxiety, depression and bipolar presenting from [facility] with CT evidence of appendicitis s/f lap appendectomy    09/17/23 - seen in urgent care [facility] for exertional shortness of breath and tachycardia.  Symptoms persist with limited improvement.  Now also with resting episodes of tachycardia.  Stress TTE and ECG WNL, as is exam and previous ECG.  Had Zio in April 2024, but worth repeating at this time.    (Please refer to APeX Allergies, Problems, Past Medical History, Past Surgical History, Social History, and Family History activities, Results for current data from these respective sections of the chart; these sections of the chart are also summarized in reports, including the Patient Summary Extracts found in Chart Review)      Summary of Outside Records:    08/20/23, Stress Echo:    1. Negative maximal stress test without echocardiographic evidence for inducible ischemia.  2.The blood pressure response was normal. The functional capacity is average for patient's age.  3.Global LV function augments normally at peak stress. ECG results are reported separately.    08/20/23, EKG:    IMPRESSION:   12-lead ECG prior to Stress Test  Normal sinus rhythm with sinus  arrhythmia  Inferior infarct , age undetermined  When compared with ECG of 17-Aug-2023,   Inferior q waves are more prominent  Summary of Prior Anesthetics: Previous anesthetic without AAC  09/2017 - Tonsillectomy        Review of Systems   Respiratory: Negative.     Cardiovascular: Negative.        Physical Exam    Airway:    Modified Mallampati score: II. Thyromental distance: > 6.5 cm. Mouth opening: good.  Neck range of motion: full.   HENT:      Mouth/Throat:      Dentition: Normal.   Cardiovascular:      Rate and Rhythm: Normal rate.   Pulmonary:      Effort: Pulmonary effort is normal.   Neurological:      Mental Status: Ah is alert and oriented to person, place, and time.     Dental: dentition is normal.                    Prepare (Pre-Operative Clinic) Assessment/Plan/Narrative      Obstructive Sleep Apnea Screening  STOPBANG Score:      S Do you Snore loudly (louder than talking or loud  enough to be heard through closed doors)? No    T Do you often  feel Tired, fatigued, or sleepy during daytime? No    O Has anyone Observed you stop breathing during  your sleep? No    P Do you have or are you being treated for high blood Pressure? No    B BMI more than 35 KG/m^2? No    A Age over 29 years old? No    N Neck circumference > 16 inches (40cm)? No    G Gender: Male? Yes    STOPBANG Total Score 1         Risk Level (based only on STOPBANG) Low                       Anesthesia Assessment and Plan  Day Of Surgery Provider Chart Review:  NPO status verified  medications reviewed  allergies reviewed  problem list reviewed  anesthesia history reviewed  pertinent labs reviewed  consults reviewed    ASA 2       Anesthesia Plan  Anesthesia Type: general  Pre-medication: midazolam  Induction Technique: IntraVenous  Invasive Monitors/Vascular Access: none  Airway Plan: supraglottic airway  Possible Advanced Airway Techniques: None  Other Techniques: None  Planned Recovery Location: PACU    Blood Product  Preparation  Blood Products Plan:  N/A, minimal risk    Anesthesia Potential Complication Discussion  At increased or higher than average risk of: Dental damage, Post-operative nausea and vomiting, Myocardial dysfunction, Allergic reaction, Pain and Sore throat  There is the possibility of rare but serious complications.    Informed Consent for Anesthesia  Consent obtained from patient    Risks, benefits and alternatives including those of invasive monitoring discussed. Increased risks (as above) discussed.  Questions invited and all answered.  Interpreter: N/A - patient/guardian's preferred language is [place]          Consent granted for anesthetic plan    Quality Measure Documentation   Opioid Therapy Planned? Yes    (See Anesthesia Record for attending attestation)    [Please note, smart link data included in this note may not reflect changes since note creation. Please see appropriate section of APeX for up-to-the minute information.]

## 2023-09-28 NOTE — Anesthesia Post-Procedure Evaluation (Addendum)
Anesthesia Post-op Evaluation    Scheduled date of Operation: 09/28/2023    Scheduled Surgeon(s):  Annye Rusk, MD  Morley Kos, MD  Bradley Ferris, MD  Scheduled Procedure(s):  LAPAROSCOPIC APPENDECTOMY  OPEN APPENDECTOMY  ACUTE CARE SERVICE SURGERY (SECONDARY)    Final Anesthesia Type: general    Assessment  Respiratory Function:      Airway Patency: Excellent      Respiratory Rate: See vitals below      SpO2: See vitals below      Overall Respiratory Assessment: Stable  Cardiovascular Function:      Pulse Rate: See Vitals Below      Blood Pressure: See Vitals Below      Cardiac status: Stable  Mental Status:      RASS Score: 0 Alert or calm  Temperature: Normothermic  Pain Control: Adequate  Nausea and Vomiting: Absent  Fluids/Hydration Status: Euvolemic        Plan  Follow-up care: As per primary team    Post-op Note Status: Complete, patient participated in evaluation, which occurred after recovery from anesthesia but prior to 48 hours from end of case        Recent Pre-op and Post-op Vital Signs  Vitals:    09/28/23 1400 09/28/23 1415 09/28/23 1430   BP: 114/75 107/70 112/81   Pulse:      Resp: 16 14 14    Temp:   36.7 C (98.1 F)   TempSrc:   Temporal   SpO2: 93% 96% 97%     Last Vital Signs Out of Room to Anesthesia Stop  Vitals Value Taken Time   Pulse 83 09/28/23 1227   Resp 7 09/28/23 1227   SpO2 98 % 09/28/23 1226   BP 114/74 09/28/23 1224   Arterial Line BP (mmHg)      Arterial Line MAP (mmHg)     Arterial Line 2 BP (mmHg)     Arterial Line 2 MAP (mmHg)     Temp     Temp src     Vitals shown include unfiled device data.    Case Tracking Events:  Event Time In   In Facility 09/27/2023 1124   Patient Requested to Pre-Op 09/28/2023 1478   Transport Request Initiated 09/28/2023 0941   In Pre-op 09/28/2023 0951   Anesthesia Start 09/28/2023 1046   Anesthesia Ready 09/28/2023 1055   In Room 09/28/2023 1046   Airway-Start 09/28/2023 1054   Procedure Start 09/28/2023 1117   PACU/Unit Notified  09/28/2023 1148   Procedure Finish 09/28/2023 1209   Extubation 09/28/2023 1211   Out of Room 09/28/2023 1219   Anesthesia Finish 09/28/2023 1228   In PACU 09/28/2023 1226

## 2023-09-28 NOTE — Brief Op Note (Signed)
Brief Operative Note    Surgeons and Role:     * Annye Rusk, MD - Primary     * Morley Kos, MD - Resident - Assisting     * Bradley Ferris, MD - Resident - Assisting    Date of Operation: October 03, 2023    Pre-Op Diagnosis Codes:      * Acute appendicitis, unspecified acute appendicitis type [K35.80]    Post-Op Diagnosis Codes:     * Acute appendicitis, unspecified acute appendicitis type [K35.80]    Procedure(s) and Anesthesia Type:     * LAPAROSCOPIC APPENDECTOMY - Anes-General     * OPEN APPENDECTOMY - Anes-General     * ACUTE CARE SERVICE SURGERY (SECONDARY) - Anes-General    Implants: * No implants in log *     ID Type Source Tests Collected by Time Destination   1 : appendix Other Other(specify) SURGICAL PATHOLOGY Annye Rusk, MD 10/03/23 1158           Findings: Uncomplicated laparoscopic appendectomy performed . No spillage/No pus/ No perforation    Drains: None    Complications: None    Disposition and Plan: PACU the home    Estimated Blood Loss: 2 mL      Was VTE prophylaxis ordered? Yes         Were NSAIDS ordered? Yes    Morley Kos, MD   PGY-3 General Surgery Resident

## 2023-09-28 NOTE — Op Note (Addendum)
Operative Report - [facility]    Surgeons and Role:     * Annye Rusk, MD - Primary     * Morley Kos, MD - Resident - Assisting     * Bradley Ferris, MD - Resident - Assisting    Date of Operation: 09/29/2023    Pre-Op Diagnosis Codes:      * Acute appendicitis, unspecified acute appendicitis type [K35.80]    Postoperative Diagnosis: acute appendicitis    Procedure: Acute appendicitis    Clinical Indications   This patient presented with acute onset of abdominal pain which then migrated to the right lower quadrant.  The patient presented to the emergency department where localized peritonitis at McBurney's point was present on exam.  CT scan showed appendicitis. We recommended laparoscopic appendectomy.     Findings  Uncomplicated laparoscopic appendectomy performed . No spillage/No pus/ No perforation.        Detailed Description of the Procedure   The patient was taken to the operating suite and was placed in the supine position. General anesthetic was administered. The patient was previously given broad spectrum antibiotics for the appendicitis, and this sufficed for wound prophylaxis. A Foley was placed and an orogastric tube was placed. The left arm was tucked. The right arm was placed on an arm board. All pressure points were carefully assessed and padded. Sequential compression devices were placed and turned on.  Abdominal hair was clipped where needed.  A time-out was performed. The patient's abdomen was then sterilely prepped and draped in the usual fashion.     A 12 mm vertical incision was made at the umbilicus. Dissection was carried down until the fascia could be grasped between Kocher clamps. The fascia was then opened vertically with an 11 blade. A gloved finger was pushed into the abdominal cavity. Gentle sweep revealed no underlying adhesions. An 0 Vicryl mattress suture was placed on the fascial edges. This was used to anchor a 12 mm Hasson blunt port. The abdomen was then  insufflated to a pressure of 15 mmHg.     We inserted a 5 mm 30-degree laparoscope and looked around. The visualized portions of the liver, stomach, colon, and intestines appeared normal.     We then placed our working ports. This was a 5mm suprapubic port and 5mm left lower quadrant port, placed to avoid the inferior epigastric vessels. These were VersaStep ports placed under direct vision.     We switched the camera to the left lower quadrant port. We put the patient in steep Trendelenburg with the left side down. We then focused our attention to the right lower quadrant.     We bluntly dissected the small bowel and omentum away from the cecum and appendix until we could see the appendix. The appendix was retracted anteriorly. The mesoappendix was then divided with sequential fires of the LigaSure device.  This took [place] right to the base. The appendix was divided from the cecum with a single fire of the endoGIA stapler. After firing, we inspected the staple line and it was intact and the staples were well formed. The appendix was placed in an Endocatch bag and pulled out of the umbilical port site.     We then suctioned out the operative field. Hemostasis was excellent. We looked down in the pouch of Douglas and suctioned there. We also looked in the right gutter and over the dome of the liver, suctioning as appropriate.      The Hasson port  was then removed. We tied the 0 Vicryl to itself and this closed the fascia there. The suprapubic port was removed. The pneumoperitoneum was released and the left lower quadrant was pulled out.     The wound edges were then washed out. Bleeding points were cauterized. Skin edges were closed with 4-0 Biosyn. Indermil was then applied as sterile dressing.     Sponge, needle, and instrument counts were correct at the end of the case. The patient tolerated the procedure well and was extubated in the operating room and taken to the recovery room in stable condition.    Estimated  Blood Loss: minimal    Pathology: appendix    Condition at end of operation: stable    Disposition and Plan: PACU then home     The primary surgeon (listed above) was scrubbed and present throughout the entire surgical procedure. If the assistant surgeon was other than a qualified resident, I certify that the services were medically necessary and there was no qualified resident available to perform the services.    Morley Kos, MD   PGY-3 General Surgery Resident      ATTENDING Presence: I, Dr. Annye Rusk, was present and scrubbed for entire procedure.

## 2023-09-28 NOTE — Anesthesia Procedure Notes (Signed)
Airway  Date/Time: 09/28/2023 10:54 AM  Urgency: elective    Difficult airway: no    Final Airway Details  Final airway type: endotracheal airway      Successful airway: ETT - single  Cuffed: yes   Successful intubation technique: direct laryngoscopy and RSI - modified  Endotracheal tube insertion site: oral  Blade: Macintosh  Blade size: #4  ETT size (mm): 7.5  Cormack-Lehane Classification (DL): grade I - full view of glottis  Placement verified by: chest auscultation, capnometry and chest rise   Inital cuff pressure (cm H2O): 30  Measured from: lips  ETT Depth (cm): 22  Number of attempts at approach: 1  Number of other approaches attempted: 0    General Information and Staff  Patient location during procedure: OR  Performed: CRNA   Anesthesiologist: Hali Marry, MD  Resident/Fellow/CRNA: Paulino Rily, CRNA    Indications and Patient Condition  Indications for airway management: anesthesia  Sedation level: anesthesia  Preoxygenated: yes  Patient position: sniffing  Mask difficulty assessment: 0 - not attempted      For full procedure information, see associated anesthesia event/encounter.

## 2023-09-28 NOTE — Discharge Summary (Signed)
[facility] - DISCHARGE SUMMARY     Patient Name: Todd Bates  Patient MRN: [id number]  Date of Birth: 1991-06-08    Facility: [facility]  Attending Physician: Alison Murray, MD    Date of Admission: 09/27/2023  Date of Discharge: 09/28/2023    Admission Diagnosis: Appendicitis  Discharge Diagnosis: Appendicitis    Discharge Disposition: Home    History (with Chief Complaint)    Todd Bates is a 32 y.o. adult hx appendicitis s/p lap appy 09/28/23.    Brief Hospital Course by Problem    The patient was admitted to the ACS service on 09/27/2023, and taken to the operating room for lap appy. Refer to the operative report for full details of the procedure. The patient tolerated the procedure well and there were no complications. Immediate postoperative course was unremarkable, and Todd Bates was transferred to the floor.     On the day of discharge, 09/28/23, Todd Bates was afebrile, hemodynamically stable, ambulating, tolerating a regular diet, and their pain was well controlled with oral analgesics. Todd Bates will follow up with ACS in clinic on the date noted below.    Physical Exam at Discharge  BP 112/81   Pulse 103   Temp 36.7 C (98.1 F) (Temporal)   Resp 14   Ht 175.3 cm (5\' 9" )   Wt 77.3 kg (170 lb 6.4 oz)   SpO2 97%   BMI 25.16 kg/m       Intake/Output Summary (Last 24 hours) at 09/28/2023 1640  Last data filed at 09/28/2023 1423  Gross per 24 hour   Intake 2558.75 ml   Output 327 ml   Net 2231.75 ml       Physical Exam  Gen: NAD  Pulm: NWOB  Abd: soft, NTND. Lap incisions c/d/I, skin glue in place. No rebound or guarding    Relevant Labs, Radiology, and Other Studies  NA    Procedures Performed and Complications  See op note    DISCHARGE INSTRUCTIONS    Discharge Diet  Regular Diet    Functional Assessment at Discharge/Activity Goals  No functional activity limits.    Allergies and Medications at Discharge    Allergies: Fig and Quince seed    Your Medications at the End of This  Hospitalization               acetaminophen (TYLENOL) 500 mg tablet Take 2 tablets (1,000 mg total) by mouth every 8 (eight) hours for 14 days    albuterol 90 mcg/actuation metered dose inhaler Inhale 2 puffs into the lungs every 4 (four) hours as needed for Wheezing    APRETUDE 600 mg/3 mL (200 mg/mL) injection Inject into the muscle    doxepin (SINEQUAN) 25 mg capsule Take 1 capsule (25 mg total) by mouth nightly at bedtime    ibuprofen (ADVIL,MOTRIN) 600 mg tablet Take 1 tablet (600 mg total) by mouth every morning, afternoon, and evening. Take with meals. for 10 days    lamoTRIgine (LAMICTAL) 200 mg tablet Take 1 tablet (200 mg total) by mouth daily    LORazepam (ATIVAN) 1 mg tablet Take 1 tablet (1 mg total) by mouth daily    multivitamin tablet Take by mouth    oxyCODONE (ROXICODONE) 5 mg tablet Take 1 tablet (5 mg total) by mouth every 8 (eight) hours as needed for Pain    polyethylene glycol (MIRALAX) 17 gram packet Take 17 g by mouth daily as needed for Constipation    propranoloL (INDERAL LA) 60 mg  24 hr capsule Take 1 capsule (60 mg total) by mouth daily    senna (SENOKOT) 8.6 mg tablet Take 2 tablets (17.2 mg total) by mouth nightly as needed for Constipation    venlafaxine (EFFEXOR-XR) 150 mg 24 hr capsule Take 1 capsule (150 mg total) by mouth daily          Immunizations Administered for This Admission       Name Date    Influenza, Split Tri PF  Deferred (Contraindication)          Discharge With Opioid Medications for:  ACUTE PAIN  According to our records, this patient was not taking opioid medications before this hospitalization.  We do not plan to manage refill and the Primary Care Provider should manage refills.  Please evaluate with each refill of an opioid that your patient meets the criteria for ongoing opioid therapy with the goal to avoid chronic opioid use. Please ensure the patient knows about alternatives for pain management and understands how to taper opioids.        Pending Tests    Pending Labs       Order Current Status    Surgical Pathology In process              Outside Follow-up          Booked [facility] Appointments  Future Appointments   Date Time Provider Department Center   10/13/2023  1:30 PM Grandville Silos, NP [id number]   11/09/2023  2:50 PM Mina Marble, MD [facility]       Pending [place] Referrals  None    Case Management Services Arranged  Case Management Services Arranged: (all recorded)             Discharge Assessment  Condition at discharge:  good             Covid-19 Vaccines       Name Date    PFIZER COVID 12+ YO 05/12/2023    Manufacturer: Pfizer, [employer]net.    Lot: [id number]    External: Auto Reconciled From Outside Source    PFIZER COVID 12+ YO 07/05/2022    Manufacturer: Pfizer, [employer].    Lot: [id number]    External: Costella Hatcher From Outside [employer] Bivalent (12+) 07/20/2021    Manufacturer: Pfizer, [employer].    Lot: [id number]    External: Costella Hatcher From Outside Source    Pfizer Covid Vaccine Wallace Cullens Top) 03/05/2021    Manufacturer: Pfizer, [employer].    Lot: [id number]    External: Auto Reconciled From Outside Source    [employer] Vaccine (Purple Top) 06/04/2020    Manufacturer: Pfizer, Avnet.    Lot: [id number]    External: MyChart Entered                Primary Care Physician  Gary Fleet  Address: [address]  [place] [address]   Phone: [phone]  Fax: [phone]     Outside Providers, for pending tests please use the following numbers:   For [facility] - Please Call: [phone]    For [facility] - Please Call: [phone]   For [facility] - Please Call: [phone]    Signed,  Orson Eva, MD  09/28/2023          Discharge Instructions provided to the patient (if any):    Discharge Instructions    Instructions for Post-Surgical Care  at Home    The name of your surgery is laparoscopic or robotic appendectomy, which means that your appendix was removed using a minimally invasive technique. This was done for  appendicitis, which means that your appendix was infected.    People live a normal life without an appendix. You do not have to change your diet or your activity as a result of having it removed.    The name of your surgeon is Dr. Annye Rusk     If you need urgent help, call [phone].  This number is always answered. However, if you need non-urgent assistance or have questions related to your surgery or condition, please send your surgeon a [facility] MyChart message.    Contact your surgeon if you develop any of the following:   abdominal pain that becomes much worse than when you left the hospital and does not improve with pain medication  the area around your incisions becomes very red, increasingly tender, or begins to drain pus (small amounts of blood-tinged fluid and mild redness are common and are no cause for concern)  your temperature goes above 100.4 F (38 C)  persistent vomiting or diarrhea  you feel as if you are getting more sick     Diet   You may eat any kind of food that you like. There are no foods that will affect your recovery. You require no changes in your diet because of this operation.       Pain Control and Other Medications   It is normal to have some pain after surgery. Your pain should be well enough controlled that you can walk around. Pain medication will help manage your pain, but it will not get rid of it completely. Your doctor may have given you a prescription for pain medicine to take as needed. For all other medications prescribed by your surgeon, take as instructed. Unless your surgeon states otherwise, you may resume any medications you were taking prior to surgery.      Pain medications: You may stop this regimen when having less pain. We recommend you stop in the following approach if you are already taking all of these in the hospital. Follow your doctors instructions if you have been directed to take these medications differently.   Opioids (e.g., oxycodone) - if you  have pain that limits your activity and that is not controlled on the medications above, you may take an opioid as prescribed. Do not drive while taking opioid medications. This is the first pain medication that you should stop when your pain starts improving.  Ibuprofen - 600 mg every 6 hours scheduled for the next two weeks, ONLY if prescribed by your doctor. Take with food. Stop this second when your pain starts improving. Do not take if you have kidney problems or history of gastrointestinal bleeding.   Tylenol - 1000 mg every 6 hours on a scheduled basis for baseline pain control. Stop this last.  Please bring unused narcotics to the follow up appointment for disposal or give them to your local pharmacy for proper disposal.    Bowel Movements   Your bowel movements may be irregular or loose for several weeks, but they will gradually return to normal. Opioid pain medications may cause constipation. As the need for your pain medicine decreases, your constipation will improve. If you go for more than a couple of days without having a bowel movement, try eating prunes or taking a gentle laxative, such as milk of magnesia. Your  doctor may have prescribed you a bowel regimen including stool softeners, please take as prescribed.      Incisions   Your incisions are covered with medical glue or surgical stickers, which will fall off after 10-14 days. Do not pull off the glue or stickers on your own. There are no stitches in the incisions that must be removed. If it is more comfortable, you may cover the incisions with Band-Aids for a week, but this is not necessary for healing.      Bathing   You may take showers 2 days after your surgery. Allow soap and water to wash over incisions, but do not scrub your incisions. avoid taking baths until your incisions have fully healed, usually at 3-4 weeks after surgery.         Activity   If you were given an abdominal binder, you should wear your binder for 6 weeks after surgery  or as instructed by your doctor.   It is common to feel a little more tired than expected for the first few days after surgery. This comes from the increased activity involved in moving around at home. You should not limit your activity, but make sure to get enough rest.   You may undertake normal daily activities as tolerated. This includes walking up and down stairs, walking outside the house, driving a car, sexual intercourse, etc.  You may return to work or full activity when you feel well enough, which usually occurs within a week. Check with your doctor if you have specific questions  Refrain from heavy lifting (>10) for at least two weeks after surgery. Begin to return to more vigorous activities, such as sports, within a week of the operation if you are feeling well enough.     Follow-up Contacts   If you have questions related to your surgery or condition in the next few weeks, please send your surgeon a [facility] MyChart message.  This is the preferred method of communication. You can send messages to your surgeon, view your clinical lab results, request medication refills, request appointments, and more. If you need to sign up, go to [email or web address] and click Request an Account.  An activation code will be sent to you within 2 business days.    Our clinic scheduler has likely already scheduled your follow-up appointment.  Please call [phone] if you do not see this appointment in your MyChart account within 2 days or need to reschedule.    Primary Care follow up: It is also important to see your primary care doctor after surgery. We recommend you schedule a visit within one week of discharge to have them review your hospital stay and follow you in the post operative period if needed.           Patient Instructions    None

## 2023-09-29 NOTE — Telephone Encounter (Signed)
NP please advise. Thank you.

## 2023-10-02 NOTE — Telephone Encounter (Signed)
S/P LAPAROSCOPIC APPENDECTOMY with Dr. Annye Rusk on 09/28/2023. Patient called in in regards to FMLA forms and had questions in regards to the form such as what are the medical facts and the amount of time for leave. Patient reported that the form would need to be faxed/emailed back to [facility] [employer]. Please call back the patient at [phone].

## 2023-10-04 ENCOUNTER — Inpatient Hospital Stay: Admit: 2023-10-04 | Payer: PRIVATE HEALTH INSURANCE | Primary: General Practice

## 2023-10-04 DIAGNOSIS — N281 Cyst of kidney, acquired: Secondary | ICD-10-CM

## 2023-10-05 LAB — SURGICAL PATHOLOGY
Pathologist: 72282
Specimen Class: 51
Subspecialty: 8

## 2023-10-13 ENCOUNTER — Ambulatory Visit: Admit: 2023-10-13 | Payer: PRIVATE HEALTH INSURANCE | Attending: Nurse Practitioner | Primary: General Practice

## 2023-10-13 DIAGNOSIS — Z4889 Encounter for other specified surgical aftercare: Secondary | ICD-10-CM

## 2023-10-13 NOTE — Progress Notes (Signed)
I saw Todd Bates today at the [facility].    APP Visit Information:   APP Service Type:  Independent  Available MD consultant:  Annye Rusk, MD  I attest that I have verbally informed the patient that I am an Advanced Practice Provider and explained my role in their team-based care.    Todd Bates is a 33 year old individual who is status post laparoscopic appendectomy with Dr. Evon Slack on1/27/25. They are here for post-operative follow-up. 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     They report pain is ok now. Todd Bates went to gym yesterday and did some light cardio and light weights, went ok. They felt exhausted few days ago after traveling on train, but better now. They are eating/drinking without difficulty, no nausea or vomiting. Initial constipation now resolved. Denies fever/chills. Incisions healing without redness/drainage/or swelling.    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       Physical Exam  Video visit-no exam    ASSESSMENT AND PLAN:  Todd Bates is healing as expected after laparoscopic appendectomy, no concern for infection at this time. Patient may return to work without restrictions. They will reach out to [place] if upon return increase in pain or find it difficult to keep up with demands of work. No further follow-up at this time.                           I performed this evaluation using real-time telehealth tools, including a live video Zoom connection between my location and the patient's location. Prior to initiating, the patient consented to perform this evaluation using telehealth tools.

## 2023-10-13 NOTE — Patient Instructions (Signed)
You may resume activity as tolerated.   Ok to submerge in water such as hot tub or bath.     If any questions send my chart message or call us at [phone]

## 2023-10-14 ENCOUNTER — Ambulatory Visit: Admit: 2023-10-14 | Payer: PRIVATE HEALTH INSURANCE | Attending: Family | Primary: General Practice

## 2023-10-14 DIAGNOSIS — Z7689 Persons encountering health services in other specified circumstances: Secondary | ICD-10-CM

## 2023-10-14 NOTE — Patient Instructions (Signed)
Dear Todd Bates,     It was a pleasure to care for you today. Please review your plan of care for this injury:    Work Status as per PCP:    No work Physicist, medical provided today.       Follow up with PCP ASAP to obtain a new work letter, sooner as needed for any new onset or worsening symptoms.     Follow up with Occupational Health for any change in work status      Call [phone] to speak with our administrative staff and schedule the appointment as needed.    Cherlynn Polo, FNP-BC  [facility]   Phone: [phone]  Fax: [phone]

## 2023-10-14 NOTE — Progress Notes (Signed)
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      SUBJECTIVE  HPI: Appendectomy on 09/28/2023 - Reports LDW on 09/25/2023. Had an urgent appendectomy on 09/28/2023. Had a Post-surgery visit yesterday with [place] NP. Wound healing as expected, no red flags or complications. Reports some intermittent 3-5/10 soreness on the core, from the belly bottom to bilateral pelvis. On. Ibuprofen (NSAIDs) 600mg /BID, Acetaminophen (Tylenol) 1000mg /BID which helps with pain. --- Has been to the gym in the last 2 days, at the gym: used the treadmill 2 miles/hour, he was a little slower than usual. Weight lifting 10 pounds as it "was too much." Lifting 10 pounds triggered 4-5/10 pain in abdominal/pelvic area. At home ADLs w/o discomfort. His works is physical, lifting adult patients, and newborns. The incision healing well. Dissolving sutures. Denies  redness, swelling, or increased pain. Denies fever, or chills.        Medications.  Therapies.  Interventions Reported:  Ibuprofen (NSAIDs)   Acetaminophen (Tylenol)       OBJECTIVE_REMOTE      There were no vitals filed for this visit.     No LMP recorded.    Physical Exam  Constitutional:       Appearance: Normal appearance.   HENT:      Head: Normocephalic and atraumatic.   Pulmonary:      Effort: Pulmonary effort is normal.      Comments: Speaking full sentences.   Neurological:      Mental Status: Todd Bates is alert.   Psychiatric:         Attention and Perception: Attention normal.         Mood and Affect: Mood normal.         Speech: Speech normal.         Behavior: Behavior normal.         Thought Content: Thought content normal.         Judgment: Judgment normal.        ASSESSMENT    Visit Diagnoses     ICD-10-CM    1. Return to work evaluation  Z76.89           PLAN    Plan Note initial 10/14/2023: VV. New to me. Teves, NP.  RTW non-work related visit. LDW on 09/25/2023.  Appendectomy on 09/28/2023. Reports wound healing as expected, no red flags or complications. Intermittent 3-5/10 soreness on the core, from the belly bottom to bilateral pelvic aspects.- sx triggered by lifting over 10 pounds - as experienced while at the gym trying to lift 10-pound weight. On NSAIDs and Acetaminophen (Tylenol) which helps with sx.  Seen by Post-surgery NP yesterday at  [facility] and given a FD work Physicist, medical. 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 Pt verbalized understanding and will get a new work Physicist, medical. Discussed 911/ED precautions. No letter provided today.   Follow up with Occupational Health for any change in work status .  Work Status as per PCP:    No work Physicist, medical provided today.       Follow up with PCP ASAP to obtain a new work letter, sooner as needed for any new onset or worsening symptoms.     Follow up with Occupational Health for any change in work status      Call [phone] to speak with our administrative staff and schedule the appointment as needed.        APP Visit Information:   APP Service Type:  Independent  Available MD consultant:  Reynolds Bowl, DO  I attest that I have verbally informed the patient that I am an Advanced Practice Provider and explained my role in their team-based care.    I spent a total of 44 non-overlapping minutes on this patient's care on the day of their visit excluding time spent related to any billed procedures. This time includes time spent with the patient as well as time spent documenting in the medical record, reviewing patient's records and tests, obtaining history, placing orders, communicating  with other healthcare professionals, counseling the patient, family, or caregiver, and/or care coordination for the diagnoses above.        Cherlynn Polo, FNP-BC  [facility]     I performed this consultation using real-time Telehealth tools, including a live video connection between my location and the patient's location. Prior to initiating the consultation, I obtained informed verbal consent to perform this consultation using Telehealth tools and answered all the questions about the Telehealth interaction.

## 2023-10-16 ENCOUNTER — Ambulatory Visit: Admit: 2023-10-16 | Attending: Family | Primary: General Practice

## 2023-10-16 DIAGNOSIS — Z7689 Persons encountering health services in other specified circumstances: Secondary | ICD-10-CM

## 2023-10-16 NOTE — Patient Instructions (Signed)
Dear [name],     It was a pleasure to care for you today. Please review your plan of care for this injury:    Work Status:    Modified Duty, Restrictions from 10/14/2023 through 10/28/2023:  No lifting > 10 lbs.  No pushing, pulling or squatting        Follow up with PCP as scheduled or sooner as needed for any new onset or worsening symptoms.     Follow up with Occupational Health for any change in work status      Call [phone] to speak with our administrative staff and schedule the appointment as needed.    Cherlynn Polo, FNP-BC  [facility]   Phone: [phone]  Fax: [phone]

## 2023-10-16 NOTE — Progress Notes (Signed)
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      SUBJECTIVE  HPI: Appendectomy on 09/28/2023 - Reports received a modified work letter from PCP.  Intermittent 3-5/10 soreness on the core, from the belly bottom to bilateral pelvic aspects.- Sx triggered by lifting over 10 pounds.  On NSAIDs and Acetaminophen (Tylenol) which helps with sx. Denies  redness, swelling, or increased pain. Denies fever, or chills.       Initial HPI on 10/14/2023: LDW on 09/25/2023. Had an urgent appendectomy on 09/28/2023. Had a Post-surgery visit yesterday with [place] NP. Wound healing as expected, no red flags or complications. Reports some intermittent 3-5/10 soreness on the core, from the belly bottom to bilateral pelvis. On. Ibuprofen (NSAIDs) 600mg /BID, Acetaminophen (Tylenol) 1000mg /BID which helps with pain. --- Has been to the gym in the last 2 days, at the gym: used the treadmill 2 miles/hour, he was a little slower than usual. Weight lifting 10 pounds as it "was too much." Lifting 10 pounds triggered 4-5/10 pain in abdominal/pelvic area. At home ADLs w/o discomfort. His works is physical, lifting adult patients, and newborns. The incision healing well. Dissolving sutures. Denies  redness, swelling, or increased pain. Denies fever, or chills.        Medications.  Therapies.  Interventions Reported:  Ibuprofen (NSAIDs)   Acetaminophen (Tylenol)       OBJECTIVE_REMOTE       There were no vitals filed for this visit.     No LMP recorded.    Physical Exam  Constitutional:       Appearance: Normal appearance.   HENT:      Head: Normocephalic and atraumatic.   Pulmonary:      Effort: Pulmonary effort is  normal.      Comments: Speaking full sentences.   Neurological:      Mental Status: Darroll is alert.   Psychiatric:         Attention and Perception: Attention normal.         Mood and Affect: Mood normal.         Speech: Speech normal.         Behavior: Behavior normal.         Thought Content: Thought content normal.         Judgment: Judgment normal.       ASSESSMENT    Visit Diagnoses     ICD-10-CM    1. Return to work evaluation  Z76.89           PLAN    Plan Note initial 10/14/2023: VV. New to me. Teves, NP.  RTW non-work related visit. LDW on 09/25/2023.  Appendectomy on 09/28/2023. Reports wound healing as expected, no red flags or complications. Intermittent 3-5/10 soreness on the core, from the belly bottom to bilateral pelvic aspects.- sx triggered by lifting over 10 pounds - as experienced while at the gym trying to lift 10-pound weight. On NSAIDs and Acetaminophen (Tylenol) which helps with sx.  Seen by Post-surgery NP yesterday at  [facility] and given a  FD work Physicist, medical. 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 Pt verbalized understanding and will get a new work Physicist, medical. Discussed 911/ED precautions. No letter provided today.   Follow up with Occupational Health for any change in work status .   Plan Note 10/16/2023: VV.  Presenting today with a new work Physicist, medical.  No changes since last visit. Intermittent 3-5/10 soreness on the core, and from the belly bottom to bilateral pelvic aspects, triggered by lifting over 10 pounds. NSAIDs and Acetaminophen (Tylenol) helps with sx. WS. Modified Duty as per PCP.  Follow up with Occupational Health for any change in work status .         Work Status:    Modified Duty, Restrictions from 10/14/2023 through 10/28/2023:  No lifting > 10 lbs.  No pushing, pulling or squatting        Follow up with PCP as scheduled or sooner as needed for any new  onset or worsening symptoms.     Follow up with Occupational Health for any change in work status      Call [phone] to speak with our administrative staff and schedule the appointment as needed.      APP Visit Information:   APP Service Type:  Independent  Available MD consultant:  Reynolds Bowl, DO  I attest that I have verbally informed the patient that I am an Advanced Practice Provider and explained my role in their team-based care.    I spent a total of 10 non-overlapping minutes on this patient's care on the day of their visit excluding time spent related to any billed procedures. This time includes time spent with the patient as well as time spent documenting in the medical record, reviewing patient's records and tests, obtaining history, placing orders, communicating with other healthcare professionals, counseling the patient, family, or caregiver, and/or care coordination for the diagnoses above.        Cherlynn Polo, FNP-BC  [facility]     I performed this consultation using real-time Telehealth tools, including a live video connection between my location and the patient's location. Prior to initiating the consultation, I obtained informed verbal consent to perform this consultation using Telehealth tools and answered all the questions about the Telehealth interaction.

## 2023-10-29 ENCOUNTER — Ambulatory Visit: Admit: 2023-10-29 | Primary: General Practice

## 2023-10-29 DIAGNOSIS — Z7689 Persons encountering health services in other specified circumstances: Secondary | ICD-10-CM

## 2023-10-29 NOTE — Progress Notes (Signed)
 Visit Date: 10/29/2023  Return to Work Visit - Non Industrial    [name]    PTP: Benson Lilly Cove, NP    Diagnosis given by PTP: s/p appendectomy on January 27,2025    [facility] -  [facility]- DEPT [facility]  Job title/ Employment Length: Clinical Nurse II/3 years     SUBJECTIVE  HPI: denies any pain or discomfort    Medications/Therapies/Interventions Reported:  N/A      OBJECTIVE  There were no vitals taken for this visit.    Limited General Exam  Patient alert and oriented, pleasant, in NAD. Color good, coherent. Respiratory effort WNL. Moving about comfortably.      ASSESSMENT      Additional Notes: 3 incisions noted, no redness, no swelling, no drainage      PLAN    Work Status Return to work full duty on 02.27.25     Follow up with PTP as scheduled, sooner as needed for any new onset or worsening symptoms. Follow up with Occupational Health for any change in work status - Call [phone] to speak with our administrative staff and schedule the appointment as needed.    Dione Plover Serita Grit, Glass blower/designer

## 2023-11-09 ENCOUNTER — Ambulatory Visit: Payer: PRIVATE HEALTH INSURANCE | Attending: Cardiovascular Disease | Primary: General Practice

## 2023-11-09 DIAGNOSIS — I493 Ventricular premature depolarization: Secondary | ICD-10-CM

## 2023-11-09 NOTE — Progress Notes (Signed)
 Todd Bates is a 33 y.o. nonbinary / gender queer person who presents with the following:    Chief Complaint              Tachycardia     Dizziness             Primary care provider: Gary Fleet, NP  Referring provider: Gary Fleet, NP        Patient Information   Cardiovascular diagnoses:  Occasional PVCs January 2025  Normal stress testing     History of present illness:  Todd Bates is a 33 y.o. person with h/o PVCs with shortness of breath, palpitation and orthostatic intolerance   Pronoun: he/they    Shortness of breath and tachycardia  Orthostatic intolerance  Had cold sx in Nov 2024 and December Rhinovirus 2024  Symptoms consisted of scratchy throat, minor cough, congestion  Treated with OTC cold remedies (Nyquil)   Since illness, feels easily out of breath and elevated HR . Does not feel like they can get a good breath  Occasionally notes skipped heart beat    Associated with lightheadedness and dizziness  Symptoms exacerbated after bout of appendicitis (January 27)    Reports episode of near syncope in August 2024    Activity: Previously active. Worked out regularly, dance fitness and strength training; usually 45-60 minutes  Less stamina; easily breathless now  Also has fullness in ears  Sleep: irregular, works as Todd Bates at [employer] 2-4 hrs   Tobacco: vapes daily   Caffeine: drinks regularly, ice coffee, caffeinated beverages   ETOH: drinks infrequently < 1 x month  Drugs: Rarely THC, amyl nitrate nitrite poppers in the past (May 2024), denies amphetamine    Medications:   - Started albuterol December 2024  - Propranolol x >1 year for tremor  - Doxepin - for sleep   - Apretude - PREP injection  - Lamotrigine for bipolar  - Effexor - anxiety  - Lorazepam - takes once per week  - Takes magnesium glycinate and MVI     Family Hx:  Mother with no CAD  Father unknown   Maternal GF: heart disease multiple heart attacks    Cardiac ROS: The patient denies: chest pain, paroxysmal nocturnal dyspnea,  orthopnea, lower extremity edema, syncope, presyncope, and claudication.        Past Medical History:   Diagnosis Date    Allergy     Anemia     Anxiety 06/2011    Asthma 12/2011    Hx of exercise induced asthma in college    Depression 06/2011    Headache     Mental disorder 06/2011    Obesity 08/19/2021    Sleep disorder 06/2011     Past Surgical History:   Procedure Laterality Date    LAPAROSCOPIC APPENDECTOMY  09/28/2023    TONSILLECTOMY  09/2017     Family History   Problem Relation Name Age of Onset    Anxiety disorder Mother Elnita Maxwell     Depression Mother Elnita Maxwell     Kidney cancer Mother Elnita Maxwell         Kidney removed due to cancer in early 20s    Bipolar disorder Mother Elnita Maxwell     Mental illness Father Jomarie Longs         Most likely undiagnosed    Anxiety disorder Sister Cala Bradford     Depression Sister Cala Bradford     Bipolar disorder Sister Cala Bradford     Diabetes Maternal Grandmother Ollen Gross  Thyroid disease Maternal Grandmother Marjorie     Alcohol abuse Maternal Grandfather James     Bleeding disorder Maternal Grandfather James     Lung cancer Maternal Grandfather Fayrene Fearing     Dementia Maternal Grandfather James     High blood pressure Maternal Grandfather James     Mental illness Maternal Cynda Familia         Most likely undiagnosed    Stroke Maternal Cynda Familia     Coronary Artery Disease Paternal Grandmother          Had a bypass    Colon cancer Paternal Grandfather Clyde     Thyroid cancer Paternal Theador Hawthorne     Coronary Artery Disease Paternal Theador Hawthorne         Had a bypass     Social History     Tobacco Use    Smoking status: Every Day     Current packs/day: 0.00     Average packs/day: 0.3 packs/day for 2.7 years (0.7 ttl pk-yrs)     Types: Vape, Cigarettes     Start date: 06/30/2017     Last attempt to quit: 09/09/2019     Years since quitting: 4.1    Smokeless tobacco: Never   Substance and Sexual Activity    Alcohol use: Yes     Alcohol/week: 1.0 standard drink of alcohol      Types: 1 Glasses of wine per week     Comment: occasionally    Drug use: Not Currently     Types: Amyl nitrate    Sexual activity: Yes     Partners: Male   Social History Narrative    Single, RN [place]     Allergies/Contraindications   Allergen Reactions    Fig Hives, Itching and Rash    Quince Seed Hives, Itching and Rash       Medications:    Outpatient Medications Marked as Taking for the 11/09/23 encounter (Video Visit) with Mina Marble, MD:     albuterol 90 mcg/actuation metered dose inhaler, Inhale 2 puffs into the lungs every 4 (four) hours as needed for Wheezing    APRETUDE 600 mg/3 mL (200 mg/mL) injection, Inject into the muscle    doxepin (SINEQUAN) 25 mg capsule, Take 1 capsule (25 mg total) by mouth nightly at bedtime    lamoTRIgine (LAMICTAL) 200 mg tablet, Take 1 tablet (200 mg total) by mouth daily    LORazepam (ATIVAN) 1 mg tablet, Take 1 tablet (1 mg total) by mouth daily    multivitamin tablet, Take by mouth    propranoloL (INDERAL LA) 60 mg 24 hr capsule, Take 1 capsule (60 mg total) by mouth daily    venlafaxine (EFFEXOR-XR) 150 mg 24 hr capsule, Take 1 capsule (150 mg total) by mouth daily         Physical Examination:  Ht 175.3 cm (5\' 9" )   Wt 74.6 kg (164 lb 6.4 oz)   BMI 24.28 kg/m   Limited video exam  General: Well appearing, in no acute distress        Diagnostic Data:    I have personally, independently reviewed patient's chart, pertinent progress notes, and available relevant data, including laboratory data, cardiac studies, and imaging reports. I have also personally and independently reviewed the following:    Stress tests:  10/20/22 Stress Echo and ECG:  1. Negative maximal stress test without echocardiographic evidence for inducible ischemia.  2.The blood pressure response was normal. The functional capacity is  average for patient's age.  3.Global LV function augments normally at peak stress. ECG results are reported separately.     Echocardiograms:  Limited resting echo for  stress echo 08/20/23  Left ventricular systolic function is normal. No segmental wall motion abnormalities present. The pulmonary artery systolic pressure is at least 21 mmHg plus the RA pressure (not calculated on this study).      Cardiac monitors:  Zio Patch 09/25/23:  Average HR 81 bpm; HR range 50 to 169 bpm  PAC: Rare  PVC: 2% Occasional  No sustained arrhythmia noted  No atrial fibrillation  Triggered episodes: 11 triggered episodes correlated with sinus rhythm and bigeminy    Zio Patch 12/05/22  Average HR 91 bpm, HR range 48 to 201 bpm  Rare PACs and PVCs  No arrhythmia   24 triggered episodes associated sinus rhythm with PVCs       Labs:  Lab Results   Component Value Date/Time    CHOL 169 12/05/2022 08:55 AM    LDL 106 12/05/2022 08:55 AM    HDL 60 12/05/2022 08:55 AM    TG 17 12/05/2022 08:55 AM    A1C 4.7 12/05/2022 08:55 AM     No results found for: "BNP", "TRPI"  Lab Results   Component Value Date/Time    NA 139 09/28/2023 06:33 AM    K 4.0 09/28/2023 06:33 AM    CL 104 09/28/2023 06:33 AM    CO2 28 09/28/2023 06:33 AM    CREAT 1.00 09/28/2023 06:33 AM    GLU 79 09/28/2023 06:33 AM    TP 7.8 09/27/2023 09:14 AM    ALB 4.3 09/27/2023 09:14 AM    AST 13 09/27/2023 09:14 AM    ALT 12 09/27/2023 09:14 AM    ALKP 65 09/27/2023 09:14 AM    TBILI 0.8 09/27/2023 09:14 AM    TSH 3.34 08/17/2023 10:59 AM     Lab Results   Component Value Date/Time    WBC 5.4 09/28/2023 06:33 AM    HGB 11.6 (L) 09/28/2023 06:33 AM    HCT 33.5 (L) 09/28/2023 06:33 AM    PLT 204 09/28/2023 06:33 AM                   Problem Based Assessment and Plan:   Todd Bates is a 33 y.o. [place]   The following diagnoses were discussed during today's visit:    # PVC  # Tachycardia   Occasional, symptomatic PVCs 2% burden on Zio Patch   Cardiac monitor April 2024: rare PACs and PVCs  Cardiac Monitor January 2025 occasional PVCs 2.%   There was correlation of triggered episodes and PVCs on both studies    TTE shows NO significant structural heart  disease or cardiomyopathy. Normal LV size and function. LVEF grossly normal  Stress echo negative for ischemia    I reviewed pathophysiology, treatment options, and treatment goals for PVCs and ventricular ectopy.   In the setting of no structural heart disease, etiology of PVCs is likely idiopathic and associated with benign course and favorable prognosis.    In the setting of a structurally normal heart and no LV dysfunction, the condition has a favorable prognosis and does not require pharmacologic or invasive intervention.  Recommend conservative management with clinical surveillance   There is no strong indication to treat the PVCs, as patient's symptoms are controlled, and there is evidence of concomitant LV dysfunction/cardiomyopathy or exercise intolerance that can be attributed to the PVCs.  If ectopy becomes more frequent or Branton develops more bothersome symptoms,  a trial of medication with AV nodal blocking agents can be considered.    Behavioral modification  I reviewed possible triggers for PVCs and symptoms, including disrupted sleep, hypovolemia, ETOH and caffeine consumption, dietary intolerances that may aggravate digestive conditions and GI distention, and somatization from stress/anxiety.   Advised patient to keep journal to identify possible triggers and minimize exposures when possible.     Encouraged patient to adopt behavioral and lifestyle modification including:   - Increase fluid intake with goal 2 to 3 L daily  - Regular exercise or activity as tolerated (ex: walking, light resistance training), as a means to improve peripheral vascular performance and muscle efficiency  - Avoid bedrest and physical deconditioning  - Improve sleep quality and sleep hygiene  - Weight loss and maintenance of a healthy weight  - Limit ETOH consumption  - Limit caffeine   - Reduce exposures to stimulants such as nicotine, decongestants  - Minimize stress when possible and develop strategies for  stress-management such as breathing and relaxation exercises, yoga, meditation, and mindfulness-based stress reduction  - Dietary modification to minimize aggravation of possible GI symptoms and distention (such as GERD, gastritis, dyspepsia and IBS)  - Stop vaping    # Dyspnea on exertion  Normal stress testing   TTE shows no structural heart disease; PASP is normal   Symptoms likely due to deconditioning   No evidence of primary cardiac condition   Recommend vaping cessation        Follow up:   PRN    After discussion with the patient, I have ordered the following tests:  No orders of the defined types were placed in this encounter.      I have provided the following written instructions to the patient in the After Visit Summary:   There are no Patient Instructions on file for this visit.       I reviewed external records from 3+ providers outside my specialty or healthcare organization as summarized in the note. I obtained independent history from someone other than the patient as summarized in the note. I personally reviewed and interpreted a test as summarized in the note.    I performed this evaluation using real-time telehealth tools, including a live video Zoom connection between my location and the patient's location. Prior to initiating, the patient consented to perform this evaluation using telehealth tools.         I spent a total of 60 minutes on this patient's care on the day of their visit excluding time spent related to any billed procedures. This time includes time spent with the patient as well as time spent documenting in the medical record, reviewing patient's records and tests, obtaining history, placing orders, communicating with other healthcare professionals, counseling the patient, family, or caregiver, and/or care coordination for the diagnoses above.

## 2023-12-30 ENCOUNTER — Ambulatory Visit: Admit: 2023-12-30 | Payer: BLUE CROSS/BLUE SHIELD | Attending: General Practice | Primary: General Practice

## 2023-12-30 DIAGNOSIS — Z0000 Encounter for general adult medical examination without abnormal findings: Secondary | ICD-10-CM

## 2023-12-30 NOTE — Progress Notes (Signed)
 SUBJECTIVE    Todd Bates is a 33 y.o. adult complaining of Annual Exam      HPI:    #DOE and tachycardia  Seen by Cardiology, not a lot of concerns  They are still having trouble with exercising, only able to "give 60%"  If they push harder, they continue to have nausea and shortness of breath  Worse when putting arms up over their head   They have quit vaping, no more nicotine  Cards recommended some labs once 3 months out from surgery  They wore their Ziopatch x1 day before they had an emergent appendectomy, should they wear it again while they are actively working out?  They have been using compounded semaglutide, started in late December, after these symptoms started      [facility]  UTD    Diet: not much appetite lately (2/2 semaglutide).  When eating, having protein shakes, sandwiches, bowls, veggie meat substitutes, eggs - could eat more fruits and veg, really focuses on protein  Cut out coffee 2/2 reflux, drinks diet sodas with caffeine 3x/day  No alcohol since beginning of the year    Exercise: most days, classes at the gym     Sleep: works nights, so irregular     No current partner, recent breakup        Current Outpatient Medications:     albuterol 90 mcg/actuation metered dose inhaler, Inhale 2 puffs into the lungs every 4 (four) hours as needed for Wheezing, Disp: 1 each, Rfl: 3    APRETUDE 600 mg/3 mL (200 mg/mL) injection, Inject into the muscle, Disp: , Rfl:     doxepin (SINEQUAN) 25 mg capsule, Take 1 capsule (25 mg total) by mouth nightly at bedtime, Disp: 30 capsule, Rfl: 0    lamoTRIgine (LAMICTAL) 200 mg tablet, Take 1 tablet (200 mg total) by mouth daily, Disp: , Rfl:     LORazepam (ATIVAN) 1 mg tablet, Take 1 tablet (1 mg total) by mouth daily, Disp: , Rfl:     multivitamin tablet, Take by mouth, Disp: , Rfl:     propranoloL (INDERAL LA) 60 mg 24 hr capsule, Take 1 capsule (60 mg total) by mouth daily, Disp: 90 capsule, Rfl: 1    venlafaxine (EFFEXOR-XR) 150 mg 24 hr capsule, Take 1 capsule  (150 mg total) by mouth daily, Disp: , Rfl:     ROS:  Constitutional: No fevers, unintentional weight loss, or fatigue.   HEENT: No issues swallowing. No new lumps or bumps.  Cardiovascular: No chest pain or palpitations.  Respiratory: No cough or wheezing.  Gastrointestinal: No abd pain or digestive problems.  MSK: No joint pain.  Skin: No rashes or bruising.   GU: No problems urinating.  Neurological: No focal weakness or numbness.  Psych: No SI/HI, or changes in mood.     All other ROS reviewed and negative unless noted in HPI or above.    OBJECTIVE    PHYSICAL EXAM:  BP 97/68 (BP Location: Left upper arm, Patient Position: Sitting, Cuff Size: Adult)   Pulse 80   Ht 175.5 cm (5' 9.09")   Wt 70 kg (154 lb 5.2 oz)   BMI 22.73 kg/m   Gen: Alert, comfortable, in no acute distress, cooperative and conversant.   Head: Normocephalic, atraumatic.  Eyes: Anicteric sclera. Lids and lashes normal, normal conjunctiva, EOMI, PERRL.  Ears: Normal pinnae, canals clear, bilat TMs grey with clear landmarks.  Nose: Nares normal, no drainage.    Mouth: Lips, mucosa and tongue  normal, no oropharyngeal lesions.  Neck: Supple.  No lymphadenopathy, no thyromegaly.   Lungs: Normal respiratory effort, speaking complete sentences. Clear to auscultation bilaterally, no crackles or wheezing.  Heart: Regular rate and rhythm, S1, S2 normal, no murmur, click, rub or gallop.  Abd: +BS, soft, non-tender. No guarding or rebound. No organomegaly.  Skin: No rashes, bruising, warm and dry with normal turgor.   Extr: No peripheral edema. Normal bulk and tone. No cyanosis.   GU: deferred  Neuro: CN II-XII intact. Normal speech.     Psych: Normal mood and affect, fair insight and judgment.    ASSESSMENT AND PLAN    Todd Bates was seen today for annual exam.    Diagnoses and all orders for this visit:    Well adult exam  Routine health exam completed today.    - UTD on age appropriate screenings and immunizations after below  - encouraged to get 150  minutes of moderate exercise per week with at least 2 days of resistance training.   - encouraged diet rich in vegetables, fruits, whole grains, lean proteins including beans and legumes, nuts and seeds    Exertional shortness of breath  Tachycardia  Symptoms persist when pt is exercising, though no further episodes of tachycardia at rest.  Seen by Cards, no acute concerns.  Pt reports today that they have been taking semaglutide since December, some concern that this could have been contributing to symptoms, either as AE or through impacting pt's nutritional intake.  Pt also reports that there were no exertional episodes during last Zio, so did not capture any true episodes, would be worth repeating now.  - Ziopatch to catch episodes during exercise  - advised pt not to use any further semaglutide and give attention to nutrition and hydration  - reviewed s/sx needing urgent or emergent follow up   - follow up after Zio  -     Monitoring patch (Ziopatch) 5-14 day; Future    Bipolar 2 disorder (CMS code)  Anxiety  Stable, following with outside psych.    Encounter for screening for lipid disorder  -     Lipid Panel (incl. LDL, HDL, Total Chol. and Trig.); Future    Encounter for screening for diabetes mellitus  -     Hemoglobin A1c; Future    Blood tests for routine general physical examination  -     Lipid Panel (incl. LDL, HDL, Total Chol. and Trig.); Future  -     Hemoglobin A1c; Future  -     Comprehensive Metabolic Panel (BMP, AST, ALT, T.BILI, ALKP, TP, ALB); Future  -     Complete Blood Count with Differential; Future    Encounter for vitamin deficiency screening  -     Vitamin B12; Future  -     Vitamin D, 25-Hydroxy; Future      The patient expressed understanding and agreement with the plan above.    Todd Caulk, FNP-BC  12/30/23       APP Visit Information:   APP Service Type:  Independent  Available MD consultant:  Donis Furnish Marzella Solan, MD           [phone] added to this visit based on our  longitudinal relationship.

## 2024-01-01 ENCOUNTER — Ambulatory Visit: Admit: 2024-01-01 | Payer: BLUE CROSS/BLUE SHIELD | Primary: General Practice

## 2024-01-01 DIAGNOSIS — Z0000 Encounter for general adult medical examination without abnormal findings: Secondary | ICD-10-CM

## 2024-01-01 DIAGNOSIS — Z1321 Encounter for screening for nutritional disorder: Secondary | ICD-10-CM

## 2024-01-01 LAB — COMPREHENSIVE METABOLIC PANEL (BMP, AST, ALT, T.BILI, ALKP, TP ALB)
AST: 16 U/L (ref 5–44)
Alanine transaminase: 14 U/L (ref 10–61)
Albumin, Serum / Plasma: 4.5 g/dL (ref 3.5–5.0)
Alkaline Phosphatase: 65 U/L (ref 38–108)
Anion Gap: 12 (ref 4–14)
Bilirubin, Total: 0.4 mg/dL (ref 0.2–1.2)
Calcium, total, Serum / Plasma: 9.6 mg/dL (ref 8.4–10.5)
Carbon Dioxide, Total: 26 mmol/L (ref 22–29)
Chloride, Serum / Plasma: 101 mmol/L (ref 101–110)
Creatinine: 1.15 mg/dL (ref 0.73–1.24)
Glucose, non-fasting: 70 mg/dL (ref 70–199)
Potassium, Serum / Plasma: 3.8 mmol/L (ref 3.5–5.0)
Protein, Total, Serum / Plasma: 7.6 g/dL (ref 6.3–8.6)
Urea Nitrogen, serum/plasma: 17 mg/dL (ref 7–25)
eGFRcr: 88 mL/min/{1.73_m2} (ref >59)

## 2024-01-01 LAB — COMPLETE BLOOD COUNT WITH DIFFERENTIAL
Abs Eosinophils: 0.08 10*9/L (ref 0.00–0.40)
Abs Imm Granulocytes: 0 10*9/L (ref <0.10)
Abs Lymphocytes: 2.68 10*9/L (ref 1.00–3.40)
Abs Monocytes: 0.42 10*9/L (ref 0.20–0.80)
Abs Neutrophils: 0.69 10*9/L — CL (ref 1.80–6.80)
Hematocrit: 35.6 % — ABNORMAL LOW (ref 41.0–53.0)
MCH: 32.3 pg (ref 26.0–34.0)
MCHC: 34.8 g/dL (ref 31.0–36.0)
MCV: 93 fL (ref 80–100)
MPV: 10.7 fL (ref 9.1–12.6)
Platelet Count: 239 10*9/L (ref 140–450)
RBC Count: 3.84 10*12/L — ABNORMAL LOW (ref 4.40–5.90)
RDW-CV: 12 % (ref 11.7–14.4)
WBC Count: 3.9 10*9/L (ref 3.4–10.0)

## 2024-01-01 LAB — HEMOGLOBIN A1C: Hemoglobin A1c: 4.6 % (ref 4.3–5.6)

## 2024-01-01 LAB — LIPID PANEL (INCL. LDL, HDL, TOTAL CHOL. AND TRIG.)
Chol HDL Ratio: 3.8 (ref <6.0)
Cholesterol, HDL: 44 mg/dL (ref >39)
Cholesterol, Total: 168 mg/dL (ref <200)
LDL Cholesterol: 106 mg/dL (ref <130)
Non HDL Cholesterol: 124 mg/dL (ref <160)
Triglycerides, serum: 90 mg/dL (ref <200)

## 2024-01-01 LAB — VITAMIN D, 25-HYDROXY: Vitamin D, 25-Hydroxy: 38 ng/mL (ref 20–50)

## 2024-01-01 LAB — VITAMIN B12: Vitamin B12: 292 ng/L — ABNORMAL LOW (ref 301–816)

## 2024-01-01 LAB — BLOOD SMEAR MORPHOLOGY

## 2024-01-01 NOTE — Telephone Encounter (Signed)
 Peripheral smear ordered as add-on.  Will consider further workup pending pt response and smear results.

## 2024-01-01 NOTE — Telephone Encounter (Signed)
 Critical Lab report by [facility] Seen    01/01/24- CBC with Differential    ANC- 0.69     Reported to PCP Reymundo Caulk, NP at 11:40 am

## 2024-01-19 ENCOUNTER — Ambulatory Visit: Admit: 2024-01-19 | Discharge: 2017-06-08 | Payer: BLUE CROSS/BLUE SHIELD | Primary: General Practice

## 2024-01-19 DIAGNOSIS — I499 Cardiac arrhythmia, unspecified: Secondary | ICD-10-CM

## 2024-01-19 DIAGNOSIS — R0602 Shortness of breath: Secondary | ICD-10-CM

## 2024-02-03 ENCOUNTER — Ambulatory Visit: Admit: 2024-02-03 | Payer: BLUE CROSS/BLUE SHIELD | Primary: General Practice

## 2024-02-03 DIAGNOSIS — D709 Neutropenia, unspecified: Secondary | ICD-10-CM

## 2024-02-12 NOTE — Telephone Encounter (Signed)
 Spoke with Alm from [facility] lab, he said they didn't receive the sample from [place] lab[facility] from [place] front and test is canceled .

## 2024-02-16 ENCOUNTER — Ambulatory Visit: Admit: 2024-02-16 | Payer: BLUE CROSS/BLUE SHIELD | Primary: General Practice

## 2024-02-16 DIAGNOSIS — D709 Neutropenia, unspecified: Secondary | ICD-10-CM

## 2024-02-16 LAB — CBC W/AUTO DIFF (LAB ONLY)
Basophils Absolute: 0.06 10*9/L (ref 0.00–0.10)
Eosinophils Absolute: 0.09 10*9/L (ref 0.00–0.40)
Hematocrit: 34.9 % — ABNORMAL LOW (ref 36.0–53.0)
Hemoglobin: 12 g/dL (ref 12.0–17.5)
Immature Granulocytes Absolute: 0.01 10*9/L (ref <0.10)
Lymphocytes Absolute: 2.91 10*9/L (ref 1.00–3.40)
MCH: 32.5 pg (ref 26.0–34.0)
MCHC: 34.4 g/dL (ref 31.0–36.0)
MCV: 95 fL (ref 80–100)
Mean Platelet Volume (MPV): 11.2 fL (ref 9.1–12.6)
Monocytes Absolute: 0.44 10*9/L (ref 0.20–0.80)
Neutrophils Absolute: 1.4 10*9/L — ABNORMAL LOW (ref 1.80–6.80)
Nucleated RBCs: 0 % (ref <=0.0)
Platelet Count: 243 10*9/L (ref 140–450)
Preliminary ANC: 1.4 10*9/L — ABNORMAL LOW (ref 1.80–6.80)
RBC: 3.69 10*12/L — ABNORMAL LOW (ref 4.00–5.90)
RDWCV: 12.3 % (ref 11.7–14.4)
WBC: 4.9 10*9/L (ref 3.4–10.0)

## 2024-05-10 MED ORDER — PROPRANOLOL ER 60 MG CAPSULE,24 HR,EXTENDED RELEASE
60 | ORAL_CAPSULE | Freq: Every day | ORAL | 1 refills | Status: AC
Start: 2024-05-10 — End: ?

## 2024-05-10 NOTE — Telephone Encounter (Signed)
RX pended. Please review and sign if appropriate. Thank you.

## 2024-06-08 MED ORDER — ALBUTEROL SULFATE HFA 90 MCG/ACTUATION AEROSOL INHALER
90 | RESPIRATORY_TRACT | 3 refills | 22.50000 days | Status: AC | PRN
Start: 2024-06-08 — End: ?
